# Patient Record
Sex: Female | Born: 1950 | Race: Black or African American | Hispanic: No | Marital: Single | Smoking: Never smoker
Health system: Southern US, Community
[De-identification: ages and names within clinical notes are randomized; demographics above are authoritative.]

## PROBLEM LIST (undated history)

## (undated) DIAGNOSIS — E079 Disorder of thyroid, unspecified: Secondary | ICD-10-CM

## (undated) DIAGNOSIS — I10 Essential (primary) hypertension: Secondary | ICD-10-CM

## (undated) HISTORY — DX: Disorder of thyroid, unspecified: E07.9

---

## 2015-01-28 ENCOUNTER — Other Ambulatory Visit: Payer: Self-pay | Admitting: Family Medicine

## 2015-01-28 ENCOUNTER — Ambulatory Visit
Admission: RE | Admit: 2015-01-28 | Discharge: 2015-01-28 | Disposition: A | Discharge status: Home / Self Care | Payer: Medicaid Other | Source: Ambulatory Visit | Attending: Family Medicine | Admitting: Family Medicine

## 2015-01-28 DIAGNOSIS — M06341 Rheumatoid nodule, right hand: Secondary | ICD-10-CM

## 2017-05-06 ENCOUNTER — Ambulatory Visit (HOSPITAL_COMMUNITY)
Admission: RE | Admit: 2017-05-06 | Discharge: 2017-05-06 | Disposition: A | Discharge status: Home / Self Care | Payer: Medicaid Other | Attending: Psychiatry | Admitting: Psychiatry

## 2017-05-06 DIAGNOSIS — H9190 Unspecified hearing loss, unspecified ear: Secondary | ICD-10-CM | POA: Insufficient documentation

## 2017-05-06 DIAGNOSIS — Z880 Allergy status to penicillin: Secondary | ICD-10-CM | POA: Diagnosis not present

## 2017-05-06 DIAGNOSIS — R6889 Other general symptoms and signs: Secondary | ICD-10-CM | POA: Diagnosis present

## 2017-05-06 NOTE — H&P (Signed)
Behavioral Health Medical Screening Exam  Gigi Gineggy Jimmey Ralpharker is an 66 y.o. female.  Total Time spent with patient: 20 minutes  Psychiatric Specialty Exam: Physical Exam  Constitutional: She is oriented to person, place, and time. She appears well-developed and well-nourished.  HENT:  Head: Normocephalic.  Right Ear: External ear normal.  Left Ear: External ear normal.  Neck: Normal range of motion.  Cardiovascular: Normal rate, normal heart sounds and intact distal pulses.   Respiratory: Effort normal and breath sounds normal.  GI: Soft. Bowel sounds are normal.  Musculoskeletal: Normal range of motion.  Neurological: She is alert and oriented to person, place, and time.  Skin: Skin is warm and dry.  Psychiatric: She has a normal mood and affect. Her behavior is normal. Judgment and thought content normal. Cognition and memory are normal.  Pt is deaf, uses sign language    Review of Systems  Psychiatric/Behavioral: Negative for depression, hallucinations, memory loss, substance abuse and suicidal ideas. The patient is nervous/anxious. The patient does not have insomnia.   All other systems reviewed and are negative.   Blood pressure (!) 163/98, pulse 94, temperature 98.7 F (37.1 C), temperature source Oral, resp. rate 18, SpO2 98 %.There is no height or weight on file to calculate BMI.  General Appearance: Casual and Fairly Groomed  Eye Contact:  Good  Speech:  speaks with sign language  Volume:  speaksd with sign language  Mood:  Anxious  Affect:  Congruent  Thought Process:  Coherent and Linear  Orientation:  Full (Time, Place, and Person)  Thought Content:  Logical  Suicidal Thoughts:  No  Homicidal Thoughts:  No  Memory:  Immediate;   Good Recent;   Good Remote;   Fair  Judgement:  Fair  Insight:  Fair  Psychomotor Activity:  Normal  Concentration: Concentration: Good and Attention Span: Good  Recall:  Good  Fund of Knowledge:Good  Language: Good  Akathisia:  No   Handed:  Right  AIMS (if indicated):     Assets:  Communication Skills Desire for Improvement Financial Resources/Insurance Housing Resilience Social Support Transportation  Sleep:       Musculoskeletal: Strength & Muscle Tone: within normal limits Gait & Station: normal Patient leans: N/A  Blood pressure (!) 163/98, pulse 94, temperature 98.7 F (37.1 C), temperature source Oral, resp. rate 18, SpO2 98 %.  Recommendations:  Based on my evaluation the patient does not appear to have an emergency medical condition.  Laveda AbbeLaurie Britton Adalee Kathan, NP 05/06/2017, 3:54 PM

## 2017-05-06 NOTE — BH Assessment (Signed)
Assessment Note  Angela Becker is a 66 y.o. female, [facility] her daughter, Angela Becker. Pt is deaf and an ASL interpreter was used via the virtual translater. Pt proved to be a poor historian and 3 ASL interpreters were used before it was determined that pt may have a language and/or cognitive deficit as she was barely answering the questions asked. Daughter subsequently shared that pt has been deemed by a doctor not competent enough to live alone safely. Angela Becker also shared that her mother didn't rear her as CPS was involved and declared pt as an unfit parent. Angela Becker added that pt has been living with her mother until @ a year ago when she died. Angela Becker says that pt has been living with her for @ a year. Angela Becker shares that she brought pt in to be assessed due to her changing moods, sometimes threatening. Angela Becker also reports that pt told her that she saw a monkey dancing around on her bed @ a month or 2 ago and pt "talks" to herself a lot throughout the day (ongoing for @ 6 months or more). Pt was unable to weigh in on these claims as it appeared that she just couldn't comprehend what was being said. Referrals for outpatient psychiatry given.   Diagnosis: Deferred  Past Medical History: No past medical history on file.  No past surgical history on file.  Family History: No family history on file.  Social History:  has no tobacco, alcohol, and drug history on file.  Additional Social History:  Alcohol / Drug Use Pain Medications: none noted Prescriptions: rxs for HBP & high cholesterol History of alcohol / drug use?: No history of alcohol / drug abuse  CIWA: CIWA-Ar BP: (!) 163/98 Pulse Rate: 94 COWS:    Allergies:  Allergies  Allergen Reactions  . Penicillins     Home Medications:  (Not in a hospital admission)  OB/GYN Status:  No LMP recorded.  General Assessment Data [facility] T[facility] Assessment: In system Is  this a Tele or Face-to-Face Assessment?: Face-to-Face Is this an Initial Assessment or a Re-assessment for this encounter?: Initial Assessment Marital status: Single Is patient pregnant?: No Pregnancy Status: No Living Arrangements: Children Can pt return to current living arrangement?: Yes Admission Status: Voluntary Is patient capable of signing voluntary admission?: No Referral Source: Self/Family/Friend Insurance type: Medicaid  Medical Screening Exam [facility]) Medical Exam completed: Yes  Crisis Care Plan Living Arrangements: Children Name of Psychiatrist: none Name of Therapist: none  Education Status Is patient currently in school?: No  Risk to self with the past 6 months Suicidal Ideation: No Has patient been a risk to self within the past 6 months prior to admission? : No Suicidal Intent: No Has patient had any suicidal intent within the past 6 months prior to admission? : No Is patient at risk for suicide?: No Suicidal Plan?: No Has patient had any suicidal plan within the past 6 months prior to admission? : No Access to Means: No Previous Attempts/Gestures: No Intentional Self Injurious Behavior: None Family Suicide History: Unknown Persecutory voices/beliefs?: No Depression: No Depression Symptoms: Feeling angry/irritable Substance abuse history and/or treatment for substance abuse?: No Suicide prevention information given to non-admitted patients: Not applicable  Risk to Others within the past 6 months Homicidal Ideation: No Does patient have any lifetime risk of violence toward others beyond the six months prior to admission? : No Thoughts of Harm to Others:  No Current Homicidal Intent: No Current Homicidal Plan: No Access to Homicidal Means: No History of harm to others?: No Assessment of Violence: None Noted Does patient have access to weapons?: No Criminal Charges Pending?: No Does patient have a court date: No Is patient on probation?:  No  Psychosis Hallucinations: Visual Delusions: None noted  Mental Status Report Appearance/Hygiene: Unremarkable Eye Contact: Fair Motor Activity: Unremarkable Speech: Other (Comment) (Pt is deaf.) Level of Consciousness: Alert Mood: Pleasant Affect: Appropriate to circumstance Anxiety Level: Minimal Thought Processes: Unable to Assess Judgement: Unable to Assess Orientation: Unable to assess Obsessive Compulsive Thoughts/Behaviors: Unable to Assess  Cognitive Functioning Concentration: Unable to Assess Memory: Unable to Assess IQ:  (UTA) Insight: Unable to Assess Impulse Control: Unable to Assess Sleep: Unable to Assess Vegetative Symptoms: None  ADLScreening [facility] Assessment Services) Patient's cognitive ability adequate to safely complete daily activities?: Yes Patient able to express need for assistance with ADLs?: Yes Independently performs ADLs?: Yes (appropriate for developmental age)  Prior Inpatient Therapy Prior Inpatient Therapy: No  Prior Outpatient Therapy Prior Outpatient Therapy: No Does patient have an ACCT team?: No Does patient have Intensive In-House Services?  : No Does patient have [facility] services? : No Does patient have P4CC services?: No  ADL Screening (condition at time of admission) Patient's cognitive ability adequate to safely complete daily activities?: Yes Is the patient deaf or have difficulty hearing?: Yes Does the patient have difficulty seeing, even when wearing glasses/contacts?: No Does the patient have difficulty concentrating, remembering, or making decisions?: Yes Patient able to express need for assistance with ADLs?: Yes Does the patient have difficulty dressing or bathing?: No Independently performs ADLs?: Yes (appropriate for developmental age) Does the patient have difficulty walking or climbing stairs?: No Weakness of Legs: None Weakness of Arms/Hands: None  Home Assistive Devices/Equipment Home Assistive  Devices/Equipment: None    Abuse/Neglect Assessment (Assessment to be complete while patient is alone) Self-Neglect: Yes, past (Comment) (per daughter)     [employer]chant [employer] officer (For Healthcare) Does Patient Have a Medical Advance Directive?: No Would patient like information on creating a medical advance directive?: No - Patient declined    Additional Information 1:1 In Past 12 Months?: No CIRT Risk: No Elopement Risk: No     Disposition:  Disposition Initial Assessment Completed for this Encounter: Yes (consulted with Elta Guadeloupe, NP) Disposition of Patient: Outpatient treatment Type of outpatient treatment: Adult (List of outpatient psychiatrists/therapists given. )  On Site Evaluation by:   Reviewed with Physician:    Laddie Aquas 05/06/2017 4:51 PM

## 2018-06-21 ENCOUNTER — Inpatient Hospital Stay (HOSPITAL_COMMUNITY)
Admission: EM | Admit: 2018-06-21 | Discharge: 2018-06-23 | DRG: 175 | Disposition: A | Discharge status: Home Health | Payer: Medicare Other | Attending: Internal Medicine | Admitting: Internal Medicine

## 2018-06-21 ENCOUNTER — Other Ambulatory Visit: Payer: Self-pay

## 2018-06-21 ENCOUNTER — Emergency Department (HOSPITAL_COMMUNITY): Payer: Medicare Other

## 2018-06-21 ENCOUNTER — Encounter (HOSPITAL_COMMUNITY): Payer: Self-pay | Admitting: Emergency Medicine

## 2018-06-21 DIAGNOSIS — I82412 Acute embolism and thrombosis of left femoral vein: Secondary | ICD-10-CM | POA: Diagnosis present

## 2018-06-21 DIAGNOSIS — E876 Hypokalemia: Secondary | ICD-10-CM | POA: Diagnosis present

## 2018-06-21 DIAGNOSIS — R0602 Shortness of breath: Secondary | ICD-10-CM

## 2018-06-21 DIAGNOSIS — I5033 Acute on chronic diastolic (congestive) heart failure: Secondary | ICD-10-CM | POA: Diagnosis present

## 2018-06-21 DIAGNOSIS — Z832 Family history of diseases of the blood and blood-forming organs and certain disorders involving the immune mechanism: Secondary | ICD-10-CM

## 2018-06-21 DIAGNOSIS — I2699 Other pulmonary embolism without acute cor pulmonale: Secondary | ICD-10-CM | POA: Diagnosis present

## 2018-06-21 DIAGNOSIS — I110 Hypertensive heart disease with heart failure: Secondary | ICD-10-CM | POA: Diagnosis present

## 2018-06-21 DIAGNOSIS — H9190 Unspecified hearing loss, unspecified ear: Secondary | ICD-10-CM | POA: Diagnosis present

## 2018-06-21 DIAGNOSIS — Z807 Family history of other malignant neoplasms of lymphoid, hematopoietic and related tissues: Secondary | ICD-10-CM

## 2018-06-21 DIAGNOSIS — Z8249 Family history of ischemic heart disease and other diseases of the circulatory system: Secondary | ICD-10-CM

## 2018-06-21 DIAGNOSIS — I2692 Saddle embolus of pulmonary artery without acute cor pulmonale: Secondary | ICD-10-CM | POA: Diagnosis not present

## 2018-06-21 DIAGNOSIS — H5461 Unqualified visual loss, right eye, normal vision left eye: Secondary | ICD-10-CM | POA: Diagnosis present

## 2018-06-21 DIAGNOSIS — Z833 Family history of diabetes mellitus: Secondary | ICD-10-CM | POA: Diagnosis not present

## 2018-06-21 DIAGNOSIS — F22 Delusional disorders: Secondary | ICD-10-CM | POA: Diagnosis present

## 2018-06-21 HISTORY — DX: Essential (primary) hypertension: I10

## 2018-06-21 LAB — BASIC METABOLIC PANEL
ANION GAP: 11 (ref 5–15)
BUN: 16 mg/dL (ref 8–23)
CHLORIDE: 101 mmol/L (ref 98–111)
CO2: 26 mmol/L (ref 22–32)
Calcium: 9.2 mg/dL (ref 8.9–10.3)
Creatinine, Ser: 0.87 mg/dL (ref 0.44–1.00)
GFR calc Af Amer: >60 mL/min (ref >60)
GLUCOSE: 122 mg/dL — AB (ref 70–99)
POTASSIUM: 3.2 mmol/L — AB (ref 3.5–5.1)
Sodium: 138 mmol/L (ref 135–145)

## 2018-06-21 LAB — I-STAT TROPONIN, ED: Troponin i, poc: 0.37 ng/mL (ref 0.00–0.08)

## 2018-06-21 LAB — I-STAT CG4 LACTIC ACID, ED: Lactic Acid, Venous: 1.12 mmol/L (ref 0.5–1.9)

## 2018-06-21 LAB — CBC
HCT: 39.3 % (ref 36.0–46.0)
Hemoglobin: 12.3 g/dL (ref 12.0–15.0)
MCH: 28.3 pg (ref 26.0–34.0)
MCHC: 31.3 g/dL (ref 30.0–36.0)
MCV: 90.3 fL (ref 78.0–100.0)
Platelets: 183 10*3/uL (ref 150–400)
RBC: 4.35 MIL/uL (ref 3.87–5.11)
RDW: 13.2 % (ref 11.5–15.5)
WBC: 10.5 10*3/uL (ref 4.0–10.5)

## 2018-06-21 MED ORDER — HEPARIN BOLUS VIA INFUSION
4300.0000 [IU] | Freq: Once | INTRAVENOUS | Status: DC
  Filled 2018-06-21: qty 4300

## 2018-06-21 MED ORDER — LEVOFLOXACIN IN D5W 750 MG/150ML IV SOLN
750.0000 mg | Freq: Once | INTRAVENOUS | Status: DC
  Filled 2018-06-21: qty 150

## 2018-06-21 MED ORDER — HEPARIN BOLUS VIA INFUSION
3700.0000 [IU] | Freq: Once | INTRAVENOUS | Status: AC
  Administered 2018-06-21: 3700 [IU] via INTRAVENOUS
  Filled 2018-06-21: qty 3700

## 2018-06-21 MED ORDER — LEVOFLOXACIN IN D5W 750 MG/150ML IV SOLN: 750.0000 mg | INTRAVENOUS | Status: DC

## 2018-06-21 MED ORDER — IOPAMIDOL (ISOVUE-370) INJECTION 76%
100.0000 mL | Freq: Once | INTRAVENOUS | Status: AC | PRN
  Administered 2018-06-21: 100 mL via INTRAVENOUS

## 2018-06-21 MED ORDER — HEPARIN (PORCINE) IN NACL 100-0.45 UNIT/ML-% IJ SOLN
1250.0000 [IU]/h | INTRAMUSCULAR | Status: DC
  Administered 2018-06-21: 1050 [IU]/h via INTRAVENOUS
  Administered 2018-06-22: 1150 [IU]/h via INTRAVENOUS
  Filled 2018-06-21 (×2): qty 250

## 2018-06-21 MED ORDER — IOPAMIDOL (ISOVUE-370) INJECTION 76%
INTRAVENOUS | Status: AC
  Filled 2018-06-21: qty 100

## 2018-06-21 NOTE — ED Notes (Signed)
Hold IV levaquin per C. Lawyer, PA

## 2018-06-21 NOTE — Progress Notes (Signed)
ANTICOAGULATION CONSULT NOTE - Initial Consult  Pharmacy Consult for heparin Indication: pulmonary embolus  Allergies  Allergen Reactions  . Penicillins     Patient Measurements: Weight: 160 lb (72.6 kg) Heparin Dosing Weight: 60.8 kg  Vital Signs: Temp: 98.7 F (37.1 C) (09/04 1900) Temp Source: Oral (09/04 1900) BP: 149/100 (09/04 2230) Pulse Rate: 117 (09/04 2230)  Labs: Recent Labs    06/21/18 1920  HGB 12.3  HCT 39.3  PLT 183  CREATININE 0.87    CrCl cannot be calculated (Unknown ideal weight.).   Medical History: Past Medical History:  Diagnosis Date  . Hypertension     Medications:    Assessment: 67 yo deaf female admitted for swollen legs. Starting heparin for PE. Not on anticoagulants PTA per patient's daughter. CBC WNL  Goal of Therapy:  Heparin level 0.3-0.7 units/ml Monitor platelets by anticoagulation protocol: Yes   Plan:  Heparin 3700 units IV x1 Heparin 1050 units/hr IV infusion  6hr heparin level  Daily CBC and heparin level   Ewing Schlein, PharmD PGY1 Pharmacy Resident 06/21/2018    11:17 PM

## 2018-06-21 NOTE — Progress Notes (Signed)
Pharmacy Antibiotic Note  Maryssa Sollazzo is a 67 y.o. female admitted on 06/21/2018 with pneumonia.  Pharmacy has been consulted for levofloxacin dosing. WBC 10.5, afebrile, sCr 0.87.  Plan: Levofloxacin 750mg  IV Q24H F/u LOT/de-escalation, cultures, renal function     Temp (24hrs), Avg:98.7 F (37.1 C), Min:98.7 F (37.1 C), Max:98.7 F (37.1 C)  Recent Labs  Lab 06/21/18 1920  WBC 10.5  CREATININE 0.87    CrCl cannot be calculated (Unknown ideal weight.).    Allergies  Allergen Reactions  . Penicillins     Antimicrobials this admission: Levofloxacin  9/4 >>    Dose adjustments this admission:   Microbiology results:  BCx:  UCx:    Sputum:    MRSA PCR:   Thank you for allowing pharmacy to be a part of this patient's care.  Ewing Schlein, PharmD PGY1 Pharmacy Resident 06/21/2018    10:10 PM

## 2018-06-21 NOTE — ED Notes (Signed)
Troponin results given to Dr. Pfeiffer 

## 2018-06-21 NOTE — ED Notes (Signed)
Bethanie Dicker (daughter)-call for updates  [phone]

## 2018-06-21 NOTE — ED Provider Notes (Signed)
Medical screening examination/treatment/procedure(s) were conducted as a shared visit with non-physician practitioner(s) and myself.  I personally evaluated the patient during the encounter.  EKG Interpretation  Date/Time:  Wednesday June 21 2018 22:49:57 EDT Ventricular Rate:  108 PR Interval:  128 QRS Duration: 80 QT Interval:  317 QTC Calculation: 425 R Axis:   101 Text Interpretation:  Sinus tachycardia Right axis deviation Nonspecific T abnormalities, lateral leads No significant change since last tracing earlier in the day  Confirmed by Richardean Canal [phone]) on 06/22/2018 7:32:59 PM  Patient has been seen with her family members present.  Patient is deaf.  Her daughter gives history of patient appearing short of breath over about the past 2 days.  Patient's daughter reports that she almost never complains about anything.  They have mostly had to observe the symptoms.  When directly asked as I am in the room, patient does indicate some chest symptoms by putting her hand to her chest centrally.  Patient is alert and appropriate.  She does not have respiratory distress at rest.  Heart borderline tachycardia.  No rub murmur gallop.  Lungs grossly clear with some diminished airflow to the bases.  Abdomen soft without guarding.  Lower extremities nontender but appears to have some subtle asymmetric swelling to the left.  Patient had borderline elevated troponin without STEMI changes on EKG. With tachycardia and observed shortness of breath, there was concern for pulmonary embolus.  This was confirmed by CTA of the chest.  Patient started on heparin and admitted.      Arby Barrette, MD 07/03/18 1351

## 2018-06-21 NOTE — ED Triage Notes (Signed)
Pt to ER for evaluation brought by daughter - patient is deaf. Able to tell me both legs are swollen. HR noted to be 119, patient states right leg hurts worse than the other.

## 2018-06-22 ENCOUNTER — Inpatient Hospital Stay (HOSPITAL_COMMUNITY): Payer: Medicare Other

## 2018-06-22 ENCOUNTER — Encounter (HOSPITAL_COMMUNITY): Payer: Self-pay | Admitting: Internal Medicine

## 2018-06-22 ENCOUNTER — Other Ambulatory Visit: Payer: Self-pay

## 2018-06-22 DIAGNOSIS — H9190 Unspecified hearing loss, unspecified ear: Secondary | ICD-10-CM | POA: Diagnosis not present

## 2018-06-22 DIAGNOSIS — Z832 Family history of diseases of the blood and blood-forming organs and certain disorders involving the immune mechanism: Secondary | ICD-10-CM | POA: Diagnosis not present

## 2018-06-22 DIAGNOSIS — I2692 Saddle embolus of pulmonary artery without acute cor pulmonale: Principal | ICD-10-CM

## 2018-06-22 DIAGNOSIS — E876 Hypokalemia: Secondary | ICD-10-CM | POA: Diagnosis not present

## 2018-06-22 DIAGNOSIS — F22 Delusional disorders: Secondary | ICD-10-CM | POA: Diagnosis not present

## 2018-06-22 DIAGNOSIS — R0602 Shortness of breath: Secondary | ICD-10-CM | POA: Diagnosis not present

## 2018-06-22 DIAGNOSIS — Z807 Family history of other malignant neoplasms of lymphoid, hematopoietic and related tissues: Secondary | ICD-10-CM | POA: Diagnosis not present

## 2018-06-22 DIAGNOSIS — I110 Hypertensive heart disease with heart failure: Secondary | ICD-10-CM | POA: Diagnosis not present

## 2018-06-22 DIAGNOSIS — I82412 Acute embolism and thrombosis of left femoral vein: Secondary | ICD-10-CM | POA: Diagnosis not present

## 2018-06-22 DIAGNOSIS — Z8249 Family history of ischemic heart disease and other diseases of the circulatory system: Secondary | ICD-10-CM | POA: Diagnosis not present

## 2018-06-22 DIAGNOSIS — R609 Edema, unspecified: Secondary | ICD-10-CM | POA: Diagnosis not present

## 2018-06-22 DIAGNOSIS — I2602 Saddle embolus of pulmonary artery with acute cor pulmonale: Secondary | ICD-10-CM | POA: Diagnosis not present

## 2018-06-22 DIAGNOSIS — I5033 Acute on chronic diastolic (congestive) heart failure: Secondary | ICD-10-CM | POA: Diagnosis not present

## 2018-06-22 DIAGNOSIS — I2699 Other pulmonary embolism without acute cor pulmonale: Secondary | ICD-10-CM

## 2018-06-22 DIAGNOSIS — Z833 Family history of diabetes mellitus: Secondary | ICD-10-CM | POA: Diagnosis not present

## 2018-06-22 DIAGNOSIS — H5461 Unqualified visual loss, right eye, normal vision left eye: Secondary | ICD-10-CM | POA: Diagnosis not present

## 2018-06-22 HISTORY — DX: Other pulmonary embolism without acute cor pulmonale: I26.99

## 2018-06-22 LAB — TROPONIN I
TROPONIN I: 0.14 ng/mL — AB (ref <0.03)
Troponin I: 0.18 ng/mL (ref <0.03)

## 2018-06-22 LAB — I-STAT CG4 LACTIC ACID, ED: Lactic Acid, Venous: 0.78 mmol/L (ref 0.5–1.9)

## 2018-06-22 LAB — COMPREHENSIVE METABOLIC PANEL
ALBUMIN: 3.1 g/dL — AB (ref 3.5–5.0)
ALT: 20 U/L (ref 0–44)
ANION GAP: 9 (ref 5–15)
AST: 33 U/L (ref 15–41)
Alkaline Phosphatase: 99 U/L (ref 38–126)
BUN: 13 mg/dL (ref 8–23)
CHLORIDE: 104 mmol/L (ref 98–111)
CO2: 26 mmol/L (ref 22–32)
Calcium: 8.8 mg/dL — ABNORMAL LOW (ref 8.9–10.3)
Creatinine, Ser: 0.75 mg/dL (ref 0.44–1.00)
GFR calc non Af Amer: >60 mL/min (ref >60)
GLUCOSE: 112 mg/dL — AB (ref 70–99)
Potassium: 3.4 mmol/L — ABNORMAL LOW (ref 3.5–5.1)
SODIUM: 139 mmol/L (ref 135–145)
Total Bilirubin: 0.8 mg/dL (ref 0.3–1.2)
Total Protein: 7.2 g/dL (ref 6.5–8.1)

## 2018-06-22 LAB — MRSA PCR SCREENING: MRSA BY PCR: NEGATIVE

## 2018-06-22 LAB — CBC
HCT: 35.8 % — ABNORMAL LOW (ref 36.0–46.0)
Hemoglobin: 11.3 g/dL — ABNORMAL LOW (ref 12.0–15.0)
MCH: 27.8 pg (ref 26.0–34.0)
MCHC: 31.6 g/dL (ref 30.0–36.0)
MCV: 88 fL (ref 78.0–100.0)
PLATELETS: 186 10*3/uL (ref 150–400)
RBC: 4.07 MIL/uL (ref 3.87–5.11)
RDW: 13.2 % (ref 11.5–15.5)
WBC: 10 10*3/uL (ref 4.0–10.5)

## 2018-06-22 LAB — BRAIN NATRIURETIC PEPTIDE: B Natriuretic Peptide: 178.2 pg/mL — ABNORMAL HIGH (ref 0.0–100.0)

## 2018-06-22 LAB — ECHOCARDIOGRAM COMPLETE
HEIGHTINCHES: 66 in
Weight: 2148.16 oz

## 2018-06-22 LAB — MAGNESIUM: MAGNESIUM: 1.6 mg/dL — AB (ref 1.7–2.4)

## 2018-06-22 LAB — HEPARIN LEVEL (UNFRACTIONATED)
Heparin Unfractionated: 0.53 IU/mL (ref 0.30–0.70)
Heparin Unfractionated: 0.43 IU/mL (ref 0.30–0.70)

## 2018-06-22 MED ORDER — LEVOFLOXACIN IN D5W 750 MG/150ML IV SOLN: 750.0000 mg | INTRAVENOUS | Status: DC

## 2018-06-22 MED ORDER — MAGNESIUM SULFATE 2 GM/50ML IV SOLN
2.0000 g | Freq: Once | INTRAVENOUS | Status: AC
  Administered 2018-06-22: 2 g via INTRAVENOUS
  Filled 2018-06-22: qty 50

## 2018-06-22 MED ORDER — ACETAMINOPHEN 325 MG PO TABS
650.0000 mg | ORAL_TABLET | Freq: Four times a day (QID) | ORAL | Status: DC | PRN
  Administered 2018-06-22 – 2018-06-23 (×2): 650 mg via ORAL
  Filled 2018-06-22 (×2): qty 2

## 2018-06-22 MED ORDER — POTASSIUM CHLORIDE CRYS ER 20 MEQ PO TBCR: 40.0000 meq | EXTENDED_RELEASE_TABLET | Freq: Once | ORAL | Status: DC

## 2018-06-22 MED ORDER — POTASSIUM CHLORIDE CRYS ER 20 MEQ PO TBCR
40.0000 meq | EXTENDED_RELEASE_TABLET | Freq: Once | ORAL | Status: AC
  Administered 2018-06-22: 40 meq via ORAL
  Filled 2018-06-22: qty 2

## 2018-06-22 NOTE — Progress Notes (Signed)
ANTICOAGULATION CONSULT NOTE - Follow Up Consult  Pharmacy Consult for Heparin Indication: pulmonary embolus  Allergies  Allergen Reactions  . Penicillins     Patient Measurements: Height: 5\' 6"  (167.6 cm) Weight: 134 lb 4.2 oz (60.9 kg) IBW/kg (Calculated) : 59.3 Heparin Dosing Weight: 60.9 kg  Vital Signs: Temp: 99.7 F (37.6 C) (09/05 0821) Temp Source: Oral (09/05 0821) BP: 119/84 (09/05 0821) Pulse Rate: 104 (09/05 0821)  Labs: Recent Labs    06/21/18 1920 06/22/18 0229 06/22/18 0820  HGB 12.3  --  11.3*  HCT 39.3  --  35.8*  PLT 183  --  186  HEPARINUNFRC  --  0.53 0.43  CREATININE 0.87  --  0.75  TROPONINI  --  0.18* 0.14*    Estimated Creatinine Clearance: 65.6 mL/min (by C-G formula based on SCr of 0.75 mg/dL).   Medications:  Scheduled:  . iopamidol      . potassium chloride  40 mEq Oral Once   Infusions:  . heparin 1,050 Units/hr (06/21/18 2316)  . magnesium sulfate 1 - 4 g bolus IVPB      Assessment: 67 yo F presented to ED with LE swelling and SOB.  CT showed saddle PE and patient was initiated on heparin.  Heparin level is at lower end of therapeutic goal on 1050 units/hr.  Will increase infusion rate to maintain therapeutic rate given bilateral PE.  Goal of Therapy:  Heparin level 0.3-0.7 units/ml Monitor platelets by anticoagulation protocol: Yes   Plan:  Increase heparin to 1150 units/hr Heparin level and CBC daily while on heparin Follow-up plans for oral anticoagulation  Lailany Enoch, Pharm.D., BCPS Clinical Pharmacist Pager: [phone] Clinical phone for 06/22/2018 from 8:30-4:00 is [phone].  **Pharmacist phone directory can now be found on [email or web address] ([email or web address]).  Listed under [facility].  06/22/2018 9:45 AM

## 2018-06-22 NOTE — Progress Notes (Signed)
ANTICOAGULATION CONSULT NOTE  Pharmacy Consult for heparin Indication: pulmonary embolus  Allergies  Allergen Reactions  . Penicillins     Patient Measurements: Height: 5\' 6"  (167.6 cm) Weight: 134 lb 4.2 oz (60.9 kg) IBW/kg (Calculated) : 59.3 Heparin Dosing Weight: 60.8 kg  Vital Signs: Temp: 99.9 F (37.7 C) (09/05 0132) Temp Source: Oral (09/05 0132) BP: 122/87 (09/05 0101) Pulse Rate: 111 (09/05 0132)  Labs: Recent Labs    06/21/18 1920 06/22/18 0229  HGB 12.3  --   HCT 39.3  --   PLT 183  --   HEPARINUNFRC  --  0.53  CREATININE 0.87  --     Estimated Creatinine Clearance: 60.4 mL/min (by C-G formula based on SCr of 0.87 mg/dL).  Assessment: 67 y.o. female with PE for heparin  Goal of Therapy:  Heparin level 0.3-0.7 units/ml Monitor platelets by anticoagulation protocol: Yes   Plan:  Continue Heparin at current rate Check heparin level in 6 hours to confirm  Geannie Risen, PharmD, BCPS  06/22/2018    3:14 AM

## 2018-06-22 NOTE — H&P (Signed)
Angela Becker TWS:[id number] DOB: 02-03-1951 DOA: 06/21/2018     PCP: Patient, No Pcp Per   Outpatient Specialists: Psychiatry    Patient arrived to ER on 06/21/18 at [facility]  Patient coming from: home Lives  With family   Chief Complaint:  Chief Complaint  Patient presents with  . Shortness of Breath    HPI: Angela Becker is a 67 y.o. female with medical history significant of deafness, HTN delusional disorder    Presented with   Shortness of breath , leg edema left>Right Shortness of breath but no fever or chills  hx of travel 1 month ago patient went to [place]. Family feels that for the past few weeks she has been moving somewhat slower they were attributing this to psychiatric medications that she was started for delusional disorder.  Family is unsure what the dosages of what medications but think is probably Haldol.  She also has been on blood pressure medications clonidine in the past but it has been changed family at this point unsure what the new medication was.  She has a sister with multiple myeloma who had blood clots but otherwise no unexplained history of blood clots in the family or patient herself.  Patient has been gaining weight.  No hormonal medications.  She has not been compliant with colonoscopy or mammogram secondary to psychiatric disorder.  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 DG Chest 2 View  . CT Angio Chest PE W/Cm &/Or Wo Cm  . Basic metabolic panel  . CBC  . Urinalysis, Routine w reflex microscopic  . Heparin level  (unfractionated)  . CBC  . Diet NPO time specified  . Cardiac monitoring  . Saline Lock IV, Maintain IV access  . Refer to Sidebar Report for: Sepsis Bundle ED/IP  . Document vital signs within 1-hour of fluid bolus completion and notify provider of bolus completion  . Document Actual / Estimated Weight  . Insert peripheral IV x 2  . Initiate Carrier Fluid Protocol  . Call Code Sepsis ([facility] [phone]) Reason for Consult? tracking  . pharmacy consult  . levofloxacin [facility]) per pharmacy consult  . heparin per pharmacy consult  . Consult to hospitalist  . Pulse oximetry, continuous  . I-stat troponin, ED  . I-Stat CG4 Lactic Acid, ED  (not at  [facility])  . ED EKG within 10 minutes  . Repeat EKG  . EKG 12-Lead    Following Medications were ordered in ER: Medications  levofloxacin (LEVAQUIN) IVPB 750 mg (0 mg Intravenous Hold 06/21/18 2240)  iopamidol (ISOVUE-370) 76 % injection (has no administration in time range)  heparin ADULT infusion 100 units/mL (25000 units/240m sodium chloride 0.45%) (1,050 Units/hr Intravenous New Bag/Given 06/21/18 2316)  levofloxacin (LEVAQUIN) IVPB 750 mg (has no administration in time range)  iopamidol (ISOVUE-370) 76 % injection 100 mL (100 mLs Intravenous Contrast Given 06/21/18 2216)  heparin bolus via infusion 3,700 Units (3,700 Units Intravenous Bolus from Bag 06/21/18 2316)    Significant initial  Findings: Abnormal Labs Reviewed  BASIC METABOLIC PANEL -  Abnormal; Notable for the following components:      Result Value   Potassium 3.2 (*)    Glucose, Bld 122 (*)    All other components within normal limits  I-STAT TROPONIN, ED - Abnormal; Notable for the following components:   Troponin i, poc 0.37 (*)    All other components within normal limits     Na 138 K 3.2  Cr    Stable,  Lab Results  Component Value Date   CREATININE 0.87 06/21/2018      WBC  10.5  HG/HCT  stable,    Component Value Date/Time   HGB 12.3 06/21/2018  1920   HCT 39.3 06/21/2018 1920       Troponin (Point of Care Test) Recent Labs    06/21/18 1943  TROPIPOC 0.37*     BNP (last 3 results) No results for input(s): BNP in the last 8760 hours.  ProBNP (last 3 results) No results for input(s): PROBNP in the last 8760 hours.  Lactic Acid, Venous    Component Value Date/Time   LATICACIDVEN 1.12 06/21/2018 2221      UA   ordered     CXR - Infiltrates bilateral  CTA chest  - Saddle PE  ECG:  Personally reviewed by me showing: HR : 108 Rhythm:  Sinus tachycardia  Ischemic    no evidence of ischemic changes QTC 425     ED Triage Vitals  Enc Vitals Group     BP 06/21/18 1900 127/87     Pulse Rate 06/21/18 1900 (!) 119     Resp 06/21/18 1900 18     Temp 06/21/18 1900 98.7 F (37.1 C)     Temp Source 06/21/18 1900 Oral     SpO2 06/21/18 1900 97 %     Weight 06/21/18 2249 160 lb (72.6 kg)     Height --      Head Circumference --      Peak Flow --      Pain Score 06/21/18 2055 0     Pain Loc --      Pain Edu? --      Excl. in [place]? --   TMAX(24)@       Latest  Blood pressure 109/83, pulse (!) 101, temperature 98.7 F (37.1 C), temperature source Oral, resp. rate 18, weight 60.8 kg, SpO2 99 %.      Hospitalist was called for admission for  Large Saddle PE   Review of Systems:    Pertinent positives include: shortness of breath at rest.  dyspnea on exertion,  Bilateral lower extremity swelling   Constitutional:  No weight loss, night sweats, Fevers, chills, fatigue, weight loss  HEENT:  No headaches, Difficulty swallowing,Tooth/dental problems,Sore throat,  No sneezing, itching, ear ache, nasal congestion, post nasal drip,  Cardio-vascular:  No chest pain, Orthopnea, PND, anasarca, dizziness, palpitations.noGI:  No heartburn, indigestion, abdominal pain, nausea, vomiting, diarrhea, change in bowel habits, loss of appetite, melena, blood in stool, hematemesis Resp:  no  No excess mucus, no productive  cough, No non-productive cough, No coughing up of blood.No change in color of mucus.No wheezing. Skin:  no rash or lesions. No jaundice GU:  no dysuria, change in color of urine, no urgency or frequency. No straining to urinate.  No flank pain.  Musculoskeletal:  No joint pain or no joint swelling. No decreased range of motion. No back pain.  Psych:  No change in mood or affect. No depression or anxiety. No memory loss.  Neuro: no localizing neurological complaints, no tingling, no weakness, no double vision, no gait abnormality, no slurred speech, no confusion  All systems reviewed and apart from Angela Becker all are negative  Past Medical History:   Past Medical History:  Diagnosis Date  . Hypertension       History reviewed. No pertinent surgical history.  Social History:  Ambulatory   independently       reports that she has never smoked. She has never used smokeless tobacco. Her alcohol and drug histories are not on file.     Family History:   Family History  Problem Relation Age of Onset  . CAD Mother   . Hypertension Mother   . Clotting disorder Sister   . Multiple myeloma Sister   . Diabetes Sister   . CAD Sister   . Hypertension Sister   . Diabetes Other     Allergies: Allergies  Allergen Reactions  . Penicillins      Prior to Admission medications   Not on File   Physical Exam: Blood pressure 109/83, pulse (!) 101, temperature 98.7 F (37.1 C), temperature source Oral, resp. rate 18, weight 60.8 kg, SpO2 99 %. 1. General:  in No Acute distress  Chronically ill -appearing 2. Psychological: Alert and  Oriented 3. Head/ENT:    Dry Mucous Membranes                          Head Non traumatic, neck supple                            Poor Dentition 4. SKIN:   decreased Skin turgor,  Skin clean Dry and intact no rash 5. Heart: Regular rate and rhythm no  Murmur, no Rub or gallop 6. Lungs:  no wheezes or crackles   7. Abdomen: Soft,  non-tender, Non  distended      bowel sounds present 8. Lower extremities: no clubbing, cyanosis, or 1+ edema left more than right 9. Neurologically Grossly intact, moving all 4 extremities equally   10. MSK: Normal range of motion   LABS:     Recent Labs  Lab 06/21/18 1920  WBC 10.5  HGB 12.3  HCT 39.3  MCV 90.3  PLT 599   Basic Metabolic Panel: Recent Labs  Lab 06/21/18 1920  NA 138  K 3.2*  CL 101  CO2 26  GLUCOSE 122*  BUN 16  CREATININE 0.87  CALCIUM 9.2      No results for input(s): AST, ALT, ALKPHOS, BILITOT, PROT, ALBUMIN in the last 168 hours. No results for input(s): LIPASE, AMYLASE in the last 168 hours. No results for input(s): AMMONIA in the last 168 hours.    HbA1C: No results for input(s): HGBA1C in the last 72 hours. CBG: No results for input(s): GLUCAP in the last 168 hours.    Urine analysis: No results found for: COLORURINE, APPEARANCEUR, LABSPEC, PHURINE, GLUCOSEU, HGBUR, BILIRUBINUR, KETONESUR, PROTEINUR, UROBILINOGEN, NITRITE, LEUKOCYTESUR     Cultures: No results found for: SDES, SPECREQUEST, CULT, REPTSTATUS   Radiological Exams on Admission: Dg Chest 2 View  Result Date: 06/21/2018 CLINICAL DATA:  Short of breath EXAM: CHEST - 2 VIEW COMPARISON:  None. FINDINGS: Bibasilar airspace disease. No pleural effusion. Heart size upper normal. No pneumothorax. IMPRESSION: Bibasilar airspace disease suspicious for pneumonia. Electronically Signed   By: Donavan Foil M.D.   On: 06/21/2018 20:00   Ct Angio Chest  Pe W/cm &/or Wo Cm  Result Date: 06/21/2018 CLINICAL DATA:  Chest pain with bilateral lower extremity swelling and tachycardia. EXAM: CT ANGIOGRAPHY CHEST WITH CONTRAST TECHNIQUE: Multidetector CT imaging of the chest was performed using the standard protocol during bolus administration of intravenous contrast. Multiplanar CT image reconstructions and MIPs were obtained to evaluate the vascular anatomy. CONTRAST:  139m ISOVUE-370 IOPAMIDOL (ISOVUE-370)  INJECTION 76% COMPARISON:  None. FINDINGS: Cardiovascular: --Pulmonary arteries: Contrast injection is sufficient to demonstrate satisfactory opacification of the pulmonary arteries to the segmental level.There is technically a saddle embolus with a thin embolic strain and crossing the pulmonary arterial bifurcation. The bulk of the embolic burden is within the proximal right and left pulmonary arteries, extending into all lobar branches on the right and into the left lower lobar branches. There is relative sparing of the left upper lobe and lingula. There is borderline right heart strain with an RV/LV ratio of approximately 1. The main pulmonary artery is within normal limits for size. --Aorta: Limited opacification of the aorta due to bolus timing optimization for the pulmonary arteries. Conventional 3 vessel aortic branching pattern. The aortic course and caliber are normal. There is no aortic atherosclerosis. --Heart: Normal size. No pericardial effusion. Mediastinum/Nodes: No mediastinal, hilar or axillary lymphadenopathy. The visualized thyroid and thoracic esophageal course are unremarkable. Lungs/Pleura: Multifocal subsegmental atelectasis. No consolidation or pulmonary infarct. No pleural effusion. Upper Abdomen: Contrast bolus timing is not optimized for evaluation of the abdominal organs. Within this limitation, the visualized organs of the upper abdomen are normal. Musculoskeletal: No chest wall abnormality. No acute or significant osseous findings. Review of the MIP images confirms the above findings. IMPRESSION: 1. Saddle pulmonary embolus with bulk of the embolic burden in the proximal right and left pulmonary arteries with borderline CT evidence of right heart strain (RV/LV Ratio = 1.0) suggestive of submassive (intermediate risk) PE. The presence of right heart strain has been associated with an increased risk of morbidity and mortality. Please activate Code PE by paging [phone] 2. No pleural  effusion, pulmonary infarct or consolidation. Critical Value/emergent results were called by telephone at the time of interpretation on 06/21/2018 at 10:36 pm [facility] who verbally acknowledged these results. Electronically Signed   By: KUlyses JarredM.D.   On: 06/21/2018 22:41    Chart has been reviewed    Assessment/Plan   67y.o. female with medical history significant of deafness, HTN delusional disorderAdmitted for large saddle PE  Present on Admission: . Pulmonary embolus (HCC) - Admit to   stepdown Initiate heparin drip  Would likely benefit from case manager consult for long term anticoagulation Hold home blood pressure medications avoid hypotension Cycle cardiac enzymes, BNP  Order echogram STAT IN AM will need to notify pulmonology if there is  evidence of significant strain  lower extremity Dopplers  Most likely risk factors for hypercoagulable state being  recent travel  sedentary lifestyle    Would benefit from hypercoagulable workup as an outpatient   [facility]   [facility] and   per PCCM patient not a candidate for tPA will obtain BNP and call back for results. Obtain echogram to be done first thing in a.m. further management pending work-up  . Hypokalemia replace and follow check magnesium level   Delusional disorder.  Once family able to bring home medications to confirm the pharmacy it would be able to restart Other plan as per orders.  DVT prophylaxis:  heparin    Code Status:  FULL CODE  as per patient   I had personally discussed CODE STATUS with patient and family     Family Communication:   Family    at  Bedside  plan of care was discussed with   Son, Son in [place], [place], Daughter,    Disposition Plan:      To home once workup is complete and patient is stable                                       Consults called: Pulmonology  Admission status:   inpatient     Expect 2 midnight stay secondary to severity of patient's current  illness including hemodynamic instability despite optimal treatment (tachycardia  Hypoxia)  I expect  patient to be hospitalized for 2 midnights requiring inpatient medical care. Patient is at high risk for adverse outcome (such as loss of life or disability) if not treated. Indication for inpatient stay as follows:  Hemodynamic instability despite maximal medical therapy,    Need for IV anticoagulation and monitoring in intensive setting such as stepdown    Level of care  SDU       Kentrel Clevenger 06/22/2018, 1:15 AM    [facility]  Pager [phone]   after 2 AM please page floor coverage PA If 7AM-7PM, please contact the day team taking care of the patient  [email or web address]  Password [email or web address]

## 2018-06-22 NOTE — Care Management (Signed)
06-22-18 BENEFITS  CHECK :  #    1. PRIMARY INS: [employer]         2. SECONDARY INS: MEDICAID [place] ACCESS EFF-DATE : 06-18-2018 CO-PAY- $3.80 FOR EACH RX

## 2018-06-22 NOTE — Progress Notes (Signed)
  Echocardiogram 2D Echocardiogram has been performed.  Leta Jungling M 06/22/2018, 8:29 AM

## 2018-06-22 NOTE — Progress Notes (Signed)
Bilateral lower extremity venous duplex has been completed. There is evidence of acute deep vein thrombosis involving the femoral, popliteal, and intramuscular gastrocnemius veins of the left lower extremity. Negative for DVT on the right. Results were given to the patient's nurse, [facility].  06/22/18 12:48 PM Olen Cordial RVT

## 2018-06-22 NOTE — Progress Notes (Signed)
@IPLOG @        PROGRESS NOTE                                                                                                                                                                                                             Patient Demographics:    Angela Becker, is a 67 y.o. female, DOB - 11-09-1950, ZOX:[id number]  Admit date - 06/21/2018   Admitting Physician Therisa Doyne, MD  Outpatient Primary MD for the patient is Patient, No Pcp Per  LOS - 0  Chief Complaint  Patient presents with  . Shortness of Breath       Brief Narrative  67 y.o. AA female with medical history significant of deafness, HTN,  delusional disorder who has recent history of travel to [place] 1 month ago came to the hospital with chief complaints of shortness of breath, she was diagnosed with large saddle PE and admitted for further treatment.   Subjective:    Angela Becker today has, No headache, No chest pain, No abdominal pain - No Nausea, No new weakness tingling or numbness, No Cough - SOB. Answered with not of the head.  She is deaf at baseline.   Assessment  & Plan :     1.  Shortness of breath caused by large saddle PE.  Positive troponin but no significant heart strain on the echocardiogram, will continue heparin drip for a total of at least 24 hours, continue supportive care, she is hemodynamically stable except for mild tachycardia, monitor on telemetry bed and heparin drip.  2.  Mild acute on chronic diastolic CHF.  EF 65%.  Does have 1+ edema in both lower extremities.  Start diuresis once #1 has stabilized and blood pressures are fine, TED stockings for now.  3.  Hypokalemia.  Replaced orally.  4. HX of delusions.  Outpatient psych follow-up.   Family Communication  :  None present  Code Status :  Full  Disposition Plan  :  Stepdown  Consults  :    Procedures  :  CTA -  1. Saddle pulmonary embolus with bulk of the embolic burden in the proximal right and left pulmonary  arteries with borderline CT evidence of right heart strain (RV/LV Ratio = 1.0) suggestive of submassive (intermediate risk) PE. The presence of right heart strain has been associated with an increased risk of morbidity and mortality. Please activate Code PE by paging [phone]. 2. No pleural effusion, pulmonary infarct or consolidation.    TTE   Left ventricle: The cavity  size was normal. There was mild focal basal hypertrophy of the septum. Systolic function was normal. The estimated ejection fraction was in the range of 60% to 65%. Wall motion was normal; there were no regional wall motion abnormalities. Doppler parameters are consistent with abnormal left ventricular relaxation (grade 1 diastolic dysfunction). - Aortic valve: There was no regurgitation. - Mitral valve: There was no regurgitation. - Right ventricle: The cavity size was mildly dilated. Wall thickness was normal. Systolic function was normal. - Right atrium: The atrium was normal in size. - Pulmonary arteries: Systolic pressure was at the upper limits of normal. PA peak pressure: 31 mm Hg (S). - Inferior vena cava: The vessel was dilated. The respirophasic diameter changes were blunted (< 50%), consistent with elevated central venous pressure. - Pericardium, extracardiac: There was no pericardial effusion.  Impressions:   No signs of significant RV strain, RVSP at the upper normal level.  DVT Prophylaxis  :    Heparin gtt  Lab Results  Component Value Date   PLT 186 06/22/2018    Diet :  Diet Order            DIET SOFT Room service appropriate? Yes; Fluid consistency: Thin  Diet effective now               Inpatient Medications Scheduled Meds: . potassium chloride  40 mEq Oral Once   Continuous Infusions: . heparin 1,150 Units/hr (06/22/18 0957)   PRN Meds:.acetaminophen  Antibiotics  :   Anti-infectives (From admission, onward)   Start     Dose/Rate Route Frequency Ordered Stop   06/22/18 2200   levofloxacin (LEVAQUIN) IVPB 750 mg  Status:  Discontinued     750 mg 100 mL/hr over 90 Minutes Intravenous Every 24 hours 06/21/18 2327 06/22/18 0128   06/22/18 0130  levofloxacin (LEVAQUIN) IVPB 750 mg  Status:  Discontinued     750 mg 100 mL/hr over 90 Minutes Intravenous Every 24 hours 06/22/18 0128 06/22/18 0344   06/21/18 2200  levofloxacin (LEVAQUIN) IVPB 750 mg  Status:  Discontinued     750 mg 100 mL/hr over 90 Minutes Intravenous  Once 06/21/18 2152 06/22/18 0128          Objective:   Vitals:   06/22/18 0101 06/22/18 0132 06/22/18 0300 06/22/18 0821  BP: 122/87 126/82 110/82 119/84  Pulse: (!) 115 (!) 111 100 (!) 104  Resp: (!) 21 20 18  (!) 22  Temp:  99.9 F (37.7 C) 98.4 F (36.9 C) 99.7 F (37.6 C)  TempSrc:  Oral Axillary Oral  SpO2: 97% 97% 100% 97%  Weight:  60.9 kg    Height:  5\' 6"  (1.676 m)      Wt Readings from Last 3 Encounters:  06/22/18 60.9 kg     Intake/Output Summary (Last 24 hours) at 06/22/2018 1130 Last data filed at 06/22/2018 0700 Gross per 24 hour  Intake 63 ml  Output -  Net 63 ml     Physical Exam  Awake Alert, Oriented X 3, No new F.N deficits,   [place].AT,PERRAL Supple Neck,No JVD, No cervical lymphadenopathy appriciated.  Symmetrical Chest wall movement, Good air movement bilaterally, CTAB RRR,No Gallops,Rubs or new Murmurs, No Parasternal Heave +ve B.Sounds, Abd Soft, No tenderness, No organomegaly appriciated, No rebound - guarding or rigidity. No Cyanosis, Clubbing , 1+edema, No new Rash or bruise       Data Review:    CBC Recent Labs  Lab 06/21/18 1920 06/22/18 0820  WBC 10.5 10.0  HGB 12.3 11.3*  HCT 39.3 35.8*  PLT 183 186  MCV 90.3 88.0  MCH 28.3 27.8  MCHC 31.3 31.6  RDW 13.2 13.2    Chemistries  Recent Labs  Lab 06/21/18 1920 06/22/18 0229 06/22/18 0820  NA 138  --  139  K 3.2*  --  3.4*  CL 101  --  104  CO2 26  --  26  GLUCOSE 122*  --  112*  BUN 16  --  13  CREATININE 0.87  --  0.75   CALCIUM 9.2  --  8.8*  MG  --  1.6*  --   AST  --   --  33  ALT  --   --  20  ALKPHOS  --   --  99  BILITOT  --   --  0.8   ------------------------------------------------------------------------------------------------------------------ No results for input(s): CHOL, HDL, LDLCALC, TRIG, CHOLHDL, LDLDIRECT in the last 72 hours.  No results found for: HGBA1C ------------------------------------------------------------------------------------------------------------------ No results for input(s): TSH, T4TOTAL, T3FREE, THYROIDAB in the last 72 hours.  Invalid input(s): FREET3 ------------------------------------------------------------------------------------------------------------------ No results for input(s): VITAMINB12, FOLATE, FERRITIN, TIBC, IRON, RETICCTPCT in the last 72 hours.  Coagulation profile No results for input(s): INR, PROTIME in the last 168 hours.  No results for input(s): DDIMER in the last 72 hours.  Cardiac Enzymes Recent Labs  Lab 06/22/18 0229 06/22/18 0820  TROPONINI 0.18* 0.14*   ------------------------------------------------------------------------------------------------------------------    Component Value Date/Time   BNP 178.2 (H) 06/21/2018 1920    Micro Results Recent Results (from the past 240 hour(s))  MRSA PCR Screening     Status: None   Collection Time: 06/22/18  2:01 AM  Result Value Ref Range Status   MRSA by PCR NEGATIVE NEGATIVE Final    Comment:        The GeneXpert MRSA Assay (FDA approved for NASAL specimens only), is one component of a comprehensive MRSA colonization surveillance program. It is not intended to diagnose MRSA infection nor to guide or monitor treatment for MRSA infections. Performed at [facility], [address], [place], [place] [address]     Radiology Reports Dg Chest 2 View  Result Date: 06/21/2018 CLINICAL DATA:  Short of breath EXAM: CHEST - 2 VIEW COMPARISON:  None. FINDINGS:  Bibasilar airspace disease. No pleural effusion. Heart size upper normal. No pneumothorax. IMPRESSION: Bibasilar airspace disease suspicious for pneumonia. Electronically Signed   By: Jasmine Pang M.D.   On: 06/21/2018 20:00   Ct Angio Chest Pe W/cm &/or Wo Cm  Result Date: 06/21/2018 CLINICAL DATA:  Chest pain with bilateral lower extremity swelling and tachycardia. EXAM: CT ANGIOGRAPHY CHEST WITH CONTRAST TECHNIQUE: Multidetector CT imaging of the chest was performed using the standard protocol during bolus administration of intravenous contrast. Multiplanar CT image reconstructions and MIPs were obtained to evaluate the vascular anatomy. CONTRAST:  ISOVUE-370 IOPAMIDOL (ISOVUE-370) INJECTION 76% COMPARISON:  None. FINDINGS: Cardiovascular: --Pulmonary arteries: Contrast injection is sufficient to demonstrate satisfactory opacification of the pulmonary arteries to the segmental level.There is technically a saddle embolus with a thin embolic strain and crossing the pulmonary arterial bifurcation. The bulk of the embolic burden is within the proximal right and left pulmonary arteries, extending into all lobar branches on the right and into the left lower lobar branches. There is relative sparing of the left upper lobe and lingula. There is borderline right heart strain with an RV/LV ratio of approximately 1. The main pulmonary artery is within normal limits for  size. --Aorta: Limited opacification of the aorta due to bolus timing optimization for the pulmonary arteries. Conventional 3 vessel aortic branching pattern. The aortic course and caliber are normal. There is no aortic atherosclerosis. --Heart: Normal size. No pericardial effusion. Mediastinum/Nodes: No mediastinal, hilar or axillary lymphadenopathy. The visualized thyroid and thoracic esophageal course are unremarkable. Lungs/Pleura: Multifocal subsegmental atelectasis. No consolidation or pulmonary infarct. No pleural effusion. Upper Abdomen:  Contrast bolus timing is not optimized for evaluation of the abdominal organs. Within this limitation, the visualized organs of the upper abdomen are normal. Musculoskeletal: No chest wall abnormality. No acute or significant osseous findings. Review of the MIP images confirms the above findings. IMPRESSION: 1. Saddle pulmonary embolus with bulk of the embolic burden in the proximal right and left pulmonary arteries with borderline CT evidence of right heart strain (RV/LV Ratio = 1.0) suggestive of submassive (intermediate risk) PE. The presence of right heart strain has been associated with an increased risk of morbidity and mortality. Please activate Code PE by paging [phone]. 2. No pleural effusion, pulmonary infarct or consolidation. Critical Value/emergent results were called by telephone at the time of interpretation on 06/21/2018 at 10:36 pm to [facility] , who verbally acknowledged these results. Electronically Signed   By: Deatra Robinson M.D.   On: 06/21/2018 22:41    Time Spent in minutes  30   Susa Raring M.D on 06/22/2018 at 11:30 AM  To page go to [email or web address] - password [facility]

## 2018-06-23 DIAGNOSIS — R0602 Shortness of breath: Secondary | ICD-10-CM | POA: Diagnosis not present

## 2018-06-23 DIAGNOSIS — I2690 Septic pulmonary embolism without acute cor pulmonale: Secondary | ICD-10-CM | POA: Diagnosis not present

## 2018-06-23 DIAGNOSIS — E876 Hypokalemia: Secondary | ICD-10-CM | POA: Diagnosis not present

## 2018-06-23 DIAGNOSIS — I2692 Saddle embolus of pulmonary artery without acute cor pulmonale: Secondary | ICD-10-CM | POA: Diagnosis not present

## 2018-06-23 LAB — BASIC METABOLIC PANEL
Anion gap: 10 (ref 5–15)
BUN: 19 mg/dL (ref 8–23)
CHLORIDE: 103 mmol/L (ref 98–111)
CO2: 25 mmol/L (ref 22–32)
CREATININE: 0.72 mg/dL (ref 0.44–1.00)
Calcium: 8.4 mg/dL — ABNORMAL LOW (ref 8.9–10.3)
GFR calc non Af Amer: >60 mL/min (ref >60)
Glucose, Bld: 118 mg/dL — ABNORMAL HIGH (ref 70–99)
Potassium: 3.8 mmol/L (ref 3.5–5.1)
SODIUM: 138 mmol/L (ref 135–145)

## 2018-06-23 LAB — MAGNESIUM: MAGNESIUM: 2 mg/dL (ref 1.7–2.4)

## 2018-06-23 LAB — CBC
HCT: 34.3 % — ABNORMAL LOW (ref 36.0–46.0)
HEMOGLOBIN: 10.8 g/dL — AB (ref 12.0–15.0)
MCH: 28.3 pg (ref 26.0–34.0)
MCHC: 31.5 g/dL (ref 30.0–36.0)
MCV: 89.8 fL (ref 78.0–100.0)
Platelets: 202 10*3/uL (ref 150–400)
RBC: 3.82 MIL/uL — ABNORMAL LOW (ref 3.87–5.11)
RDW: 13.3 % (ref 11.5–15.5)
WBC: 8.8 10*3/uL (ref 4.0–10.5)

## 2018-06-23 LAB — HEPARIN LEVEL (UNFRACTIONATED): HEPARIN UNFRACTIONATED: 0.29 [IU]/mL — AB (ref 0.30–0.70)

## 2018-06-23 MED ORDER — FUROSEMIDE 40 MG PO TABS
40.0000 mg | ORAL_TABLET | Freq: Once | ORAL | Status: AC
  Administered 2018-06-23: 40 mg via ORAL
  Filled 2018-06-23: qty 1

## 2018-06-23 MED ORDER — APIXABAN 5 MG PO TABS: 10.0000 mg | ORAL_TABLET | Freq: Two times a day (BID) | ORAL | 0 refills | Status: DC

## 2018-06-23 MED ORDER — APIXABAN 5 MG PO TABS: 5.0000 mg | ORAL_TABLET | Freq: Two times a day (BID) | ORAL | 0 refills | Status: DC

## 2018-06-23 MED ORDER — APIXABAN 5 MG PO TABS
10.0000 mg | ORAL_TABLET | Freq: Two times a day (BID) | ORAL | Status: DC
  Administered 2018-06-23: 10 mg via ORAL
  Filled 2018-06-23: qty 2

## 2018-06-23 MED ORDER — APIXABAN 5 MG PO TABS: 5.0000 mg | ORAL_TABLET | Freq: Two times a day (BID) | ORAL | Status: DC

## 2018-06-23 NOTE — Discharge Summary (Signed)
Angela Becker UJW:[id number] DOB: [birth year] DOA: 06/21/2018  PCP: Patient, No Pcp Per  Admit date: 06/21/2018  Discharge date: 06/23/2018  Admitted From: Home   Disposition:  Home   Recommendations for Outpatient Follow-up:   Follow up with PCP in 1-2 weeks  PCP Please obtain BMP/CBC, 2 view CXR in 1week,  (see Discharge instructions)   PCP Please follow up on the following pending results:    Home Health: None Equipment/Devices: None  Consultations: None Discharge Condition: Stable   CODE STATUS: Full   Diet Recommendation: Heart Healthy     Chief Complaint  Patient presents with  . Shortness of Breath     Brief history of present illness from the day of admission and additional interim summary    67 y.o.AA femalewith medical history significant of deafness, HTN, delusional disorder who has recent history of travel to [place] 1 month ago came to the hospital with chief complaints of shortness of breath, she was diagnosed with large saddle PE and admitted for further treatment.                                                                 Hospital Course    1.  Shortness of breath caused by large saddle PE.  Positive troponin but no significant heart strain on the echocardiogram, she was maintained on heparin drip with good results, she also underwent lower extremity venous duplex showing extensive DVT in the left leg, with supportive care she is back to her baseline and symptom-free, hemodynamically stable, has been switched to Eliquis this morning, case management has been consulted to arrange for home Eliquis 30-day coupon thereafter per PCP.  Updated daughter, discharge home later this evening.  2.  Mild acute on chronic diastolic CHF.  EF 65%.  Does have 1+ edema left more than right leg, some likely  due to left lower extremity DVT as well.  Lungs are absolutely clear, single dose of oral Lasix here, continue with TED stockings upon discharge and follow with PCP.  3.  Hypokalemia.  Replaced orally and stable.  4. HX of delusions.  Outpatient psych follow-up.  No acute issues.  5.  Mild non-ACS pattern troponin rise.  Trend stable, due to right-sided stress caused by saddle PE.  Treatment as a #1 above.    Discharge diagnosis     Active Problems:   Pulmonary embolus (HCC)   Hypokalemia   Pulmonary embolism (HCC)   SOB (shortness of breath)    Discharge instructions    Discharge Instructions    Diet - low sodium heart healthy   Complete by:  As directed    Discharge instructions   Complete by:  As directed    Follow with Primary MD in 7 days   Get CBC, CMP, 2 view Chest  X ray checked  by Primary MD  in 5-7 days   Activity: As tolerated with Full fall precautions use walker/cane & assistance as needed  Disposition Home    Diet:  Heart Healthy      Special Instructions: If you have smoked or chewed Tobacco  in the last 2 yrs please stop smoking, stop any regular Alcohol  and or any Recreational drug use.  On your next visit with your primary care physician please Get Medicines reviewed and adjusted.  Please request your Prim.MD to go over all Hospital Tests and Procedure/Radiological results at the follow up, please get all Hospital records sent to your Prim MD by signing hospital release before you go home.  If you experience worsening of your admission symptoms, develop shortness of breath, life threatening emergency, suicidal or homicidal thoughts you must seek medical attention immediately by calling 911 or calling your MD immediately  if symptoms less severe.  You Must read complete instructions/literature along with all the possible adverse reactions/side effects for all the Medicines you take and that have been prescribed to you. Take any new Medicines after you  have completely understood and accpet all the possible adverse reactions/side effects.   Increase activity slowly   Complete by:  As directed       Discharge Medications   Allergies as of 06/23/2018      Reactions   Penicillins       Medication List    TAKE these medications   amLODipine 10 MG tablet Commonly known as:  NORVASC Take 10 mg by mouth daily.   apixaban 5 MG Tabs tablet Commonly known as:  ELIQUIS Take 2 tablets (10 mg total) by mouth 2 (two) times daily for 6 days.   apixaban 5 MG Tabs tablet Commonly known as:  ELIQUIS Take 1 tablet (5 mg total) by mouth 2 (two) times daily. Start taking on:  06/30/2018   COMBIGAN 0.2-0.5 % ophthalmic solution Generic drug:  brimonidine-timolol Place 1 drop into the right eye 2 (two) times daily.   DUREZOL OP Place 1 drop into the right eye 3 (three) times daily.   haloperidol 5 MG tablet Commonly known as:  HALDOL Take 5 mg by mouth at bedtime.       Follow-up Information    Leilani Able, MD. Schedule an appointment as soon as possible for a visit in 1 week(s).   Specialty:  Family Medicine Contact information: [address] [place] [address] [phone]           Major procedures and Radiology Reports - PLEASE review detailed and final reports thoroughly  -     Lower extremity venous duplex.  There is evidence of acute deep vein thrombosis involving the femoral, popliteal, and intramuscular gastrocnemius veins of the left lower extremity.  Negative for DVT on the right.    TTE   Left ventricle: The cavity size was normal. There was mild focal basal hypertrophy of the septum. Systolic function was normal. The estimated ejection fraction was in the range of 60% to 65%. Wall motion was normal; there were no regional wall motion abnormalities. Doppler parameters are consistent with abnormal left ventricular relaxation (grade 1 diastolic dysfunction). - Aortic valve: There was no regurgitation. -  Mitral valve: There was no regurgitation. - Right ventricle: The cavity size was mildly dilated. Wall thickness was normal. Systolic function was normal. - Right atrium: The atrium was normal in size. - Pulmonary arteries: Systolic pressure was at the upper  limits of normal. PA peak pressure: 31 mm Hg (S). - Inferior vena cava: The vessel was dilated. The respirophasicdiameter changes were blunted (<50%), consistent with elevatedcentral venous pressure. - Pericardium, extracardiac: There was no pericardial effusion.  Impressions:   No signs of significant RV strain, RVSP at the upper normallevel.   Dg Chest 2 View  Result Date: 06/21/2018 CLINICAL DATA:  Short of breath EXAM: CHEST - 2 VIEW COMPARISON:  None. FINDINGS: Bibasilar airspace disease. No pleural effusion. Heart size upper normal. No pneumothorax. IMPRESSION: Bibasilar airspace disease suspicious for pneumonia. Electronically Signed   By: Jasmine Pang M.D.   On: 06/21/2018 20:00   Ct Angio Chest Pe W/cm &/or Wo Cm  Result Date: 06/21/2018 CLINICAL DATA:  Chest pain with bilateral lower extremity swelling and tachycardia. EXAM: CT ANGIOGRAPHY CHEST WITH CONTRAST TECHNIQUE: Multidetector CT imaging of the chest was performed using the standard protocol during bolus administration of intravenous contrast. Multiplanar CT image reconstructions and MIPs were obtained to evaluate the vascular anatomy. CONTRAST:  ISOVUE-370 IOPAMIDOL (ISOVUE-370) INJECTION 76% COMPARISON:  None. FINDINGS: Cardiovascular: --Pulmonary arteries: Contrast injection is sufficient to demonstrate satisfactory opacification of the pulmonary arteries to the segmental level.There is technically a saddle embolus with a thin embolic strain and crossing the pulmonary arterial bifurcation. The bulk of the embolic burden is within the proximal right and left pulmonary arteries, extending into all lobar branches on the right and into the left lower lobar branches. There  is relative sparing of the left upper lobe and lingula. There is borderline right heart strain with an RV/LV ratio of approximately 1. The main pulmonary artery is within normal limits for size. --Aorta: Limited opacification of the aorta due to bolus timing optimization for the pulmonary arteries. Conventional 3 vessel aortic branching pattern. The aortic course and caliber are normal. There is no aortic atherosclerosis. --Heart: Normal size. No pericardial effusion. Mediastinum/Nodes: No mediastinal, hilar or axillary lymphadenopathy. The visualized thyroid and thoracic esophageal course are unremarkable. Lungs/Pleura: Multifocal subsegmental atelectasis. No consolidation or pulmonary infarct. No pleural effusion. Upper Abdomen: Contrast bolus timing is not optimized for evaluation of the abdominal organs. Within this limitation, the visualized organs of the upper abdomen are normal. Musculoskeletal: No chest wall abnormality. No acute or significant osseous findings. Review of the MIP images confirms the above findings. IMPRESSION: 1. Saddle pulmonary embolus with bulk of the embolic burden in the proximal right and left pulmonary arteries with borderline CT evidence of right heart strain (RV/LV Ratio = 1.0) suggestive of submassive (intermediate risk) PE. The presence of right heart strain has been associated with an increased risk of morbidity and mortality. Please activate Code PE by paging [phone]. 2. No pleural effusion, pulmonary infarct or consolidation. Critical Value/emergent results were called by telephone at the time of interpretation on 06/21/2018 at 10:36 pm to [facility] , who verbally acknowledged these results. Electronically Signed   By: Deatra Robinson M.D.   On: 06/21/2018 22:41    Micro Results     Recent Results (from the past 240 hour(s))  MRSA PCR Screening     Status: None   Collection Time: 06/22/18  2:01 AM  Result Value Ref Range Status   MRSA by PCR NEGATIVE  NEGATIVE Final    Comment:        The GeneXpert MRSA Assay (FDA approved for NASAL specimens only), is one component of a comprehensive MRSA colonization surveillance program. It is not intended to diagnose MRSA infection nor to guide or  monitor treatment for MRSA infections. Performed at [facility], [address] [place], [place] [address]     Today   Subjective    Lillie Portner today has no headache,no chest abdominal pain, no SOB.   Objective   Blood pressure 115/75, pulse 82, temperature 98.2 F (36.8 C), temperature source Oral, resp. rate 18, height 5\' 6"  (1.676 m), weight 60.9 kg, SpO2 99 %.   Intake/Output Summary (Last 24 hours) at 06/23/2018 1145 Last data filed at 06/23/2018 0921 Gross per 24 hour  Intake 831.9 ml  Output 450 ml  Net 381.9 ml    Exam  Awake Alert,  No new F.N deficits, Normal affect [place].AT,PERRAL Supple Neck,No JVD, No cervical lymphadenopathy appriciated.  Symmetrical Chest wall movement, Good air movement bilaterally, CTAB RRR,No Gallops,Rubs or new Murmurs, No Parasternal Heave +ve B.Sounds, Abd Soft, Non tender, No organomegaly appriciated, No rebound -guarding or rigidity. No Cyanosis, Clubbing or edema, No new Rash or bruise   Data Review   CBC w Diff:  Lab Results  Component Value Date   WBC 8.8 06/23/2018   HGB 10.8 (L) 06/23/2018   HCT 34.3 (L) 06/23/2018   PLT 202 06/23/2018    CMP:  Lab Results  Component Value Date   NA 138 06/23/2018   K 3.8 06/23/2018   CL 103 06/23/2018   CO2 25 06/23/2018   BUN 19 06/23/2018   CREATININE 0.72 06/23/2018   PROT 7.2 06/22/2018   ALBUMIN 3.1 (L) 06/22/2018   BILITOT 0.8 06/22/2018   ALKPHOS 99 06/22/2018   AST 33 06/22/2018   ALT 20 06/22/2018  .   Total Time in preparing paper work, data evaluation and todays exam - 35 minutes  Susa Raring M.D on 06/23/2018 at 11:45 AM  [facility]   Office  [phone]

## 2018-06-23 NOTE — Plan of Care (Signed)
Discussed plan of care for the evening with patient's daughter.  Emphasized the importance of using the call bell when assistance is needed.  Minimal teach back displayed.

## 2018-06-23 NOTE — Progress Notes (Addendum)
ANTICOAGULATION CONSULT NOTE - Follow Up Consult  Pharmacy Consult for heparin > apixaban Indication: pulmonary embolus  Labs: Recent Labs    06/21/18 1920 06/22/18 0229 06/22/18 0820 06/23/18 0320  HGB 12.3  --  11.3* 10.8*  HCT 39.3  --  35.8* 34.3*  PLT 183  --  186 202  HEPARINUNFRC  --  0.53 0.43 0.29*  CREATININE 0.87  --  0.75 0.72  TROPONINI  --  0.18* 0.14*  --     Assessment: 67yo female now subtherapeutic on heparin despite rate increase yesterday to maintain therapeutic level; RN reports no gtt issues; Hgb trending down but RN reports no overt signs of bleeding.  Goal of Therapy:  Heparin level 0.3-0.7 units/ml   Plan:  Will increase heparin gtt by 1-2 units/kg/hr to 1250 units/hr and check level in 6 hours.    Vernard Gambles, PharmD, BCPS  06/23/2018,5:13 AM   Addendum: Now to transition from heparin to apixaban.  Will start apixaban 10mg  BID x1wk followed by 5mg  BID and begin education. VB 7:20 AM

## 2018-06-23 NOTE — Care Management Note (Addendum)
Case Management Note  Patient Details  Name: Angela Becker MRN: [id number] Date of Birth: 1951-06-22  Subjective/Objective:  Patient is deaf, from home, presents with PE was on hep drip,now off switched to eliquis, NCM gave patient the eliquis 30 day savings coupon card.  And her copay with Medicaid is 3.50. PT eval orders. Awaiting recs.  PCP is [facility].   Per pt eval rec HHPT, NCM spoke with patient daughter, Angela Becker  , patient states ok to talk with her.  [name] chose [facility] for HHPT and rolling walker.  Referral given to [facility] with [facility] . Soc will begin 24-48 hrs post dc.                                    Action/Plan: DC home with [facility] services when ready.  Expected Discharge Date:  06/23/18               Expected Discharge Plan:  Home/Self Care  In-House Referral:     Discharge planning Services  CM Consult, Medication Assistance  Post Acute Care Choice:  Durable Medical Equipment, Home Health Choice offered to:  Adult Children  DME Arranged:  Walker rolling DME Agency:  [facility]  HH Arranged:  PT, RN [facility] Agency:  [facility]  Status of Service:  Completed, signed off  If discussed at Long Length of Stay Meetings, dates discussed:    Additional Comments:  Leone Haven, RN 06/23/2018, 12:30 PM

## 2018-06-23 NOTE — ED Provider Notes (Signed)
[place] [facility] Provider Note   CSN: [id number] Arrival date & time: 06/21/18  1846     History   Chief Complaint Chief Complaint  Patient presents with  . Shortness of Breath    HPI Angela Becker is a 67 y.o. female.  HPI Patient presents to the emergency department with increasing shortness of breath over the last 2 days.  The daughter states that she seems like she was having some wheezing.  Daughter also states that she seems to be having some difficulty with breathing.  The daughter is giving me the history because the patient is deaf.  The patient denies chest pain, headache,blurred vision, neck pain, fever, cough, weakness, numbness, dizziness, anorexia, edema, abdominal pain, nausea, vomiting, diarrhea, rash, back pain, dysuria, hematemesis, bloody stool, near syncope, or syncope.  States she did seem to have some pain in the lower ribs on the right and left. Past Medical History:  Diagnosis Date  . Hypertension     Patient Active Problem List   Diagnosis Date Noted  . Pulmonary embolus ([place]) 06/22/2018  . Hypokalemia 06/22/2018  . Pulmonary embolism ([place]) 06/22/2018    History reviewed. No pertinent surgical history.   OB History   None      Home Medications    Prior to Admission medications   Medication Sig Start Date End Date Taking? Authorizing Provider  amLODipine (NORVASC) 10 MG tablet Take 10 mg by mouth daily.   Yes [provider]  brimonidine-timolol (COMBIGAN) 0.2-0.5 % ophthalmic solution Place 1 drop into the right eye 2 (two) times daily.   Yes [provider]  Difluprednate (DUREZOL OP) Place 1 drop into the right eye 3 (three) times daily.   Yes [provider]  haloperidol (HALDOL) 5 MG tablet Take 5 mg by mouth at bedtime.   Yes [provider]    Family History Family History  Problem Relation Age of Onset  . CAD Mother   . Hypertension Mother   . Clotting disorder Sister   .  Multiple myeloma Sister   . Diabetes Sister   . CAD Sister   . Hypertension Sister   . Diabetes Other     Social History Social History   Tobacco Use  . Smoking status: Never Smoker  . Smokeless tobacco: Never Used  Substance Use Topics  . Alcohol use: Not on file  . Drug use: Not on file     Allergies   Penicillins   Review of Systems Review of Systems  All other systems negative except as documented in the HPI. All pertinent positives and negatives as reviewed in the HPI. Physical Exam Updated Vital Signs BP 117/71 (BP Location: Left Arm)   Pulse 91   Temp 98.2 F (36.8 C) (Oral)   Resp 17   Ht '5\' 6"'  (1.676 m)   Wt 60.9 kg   SpO2 100%   BMI 21.67 kg/m   Physical Exam  Constitutional: She is oriented to person, place, and time. She appears well-developed and well-nourished. No distress.  HENT:  Head: Normocephalic and atraumatic.  Mouth/Throat: Oropharynx is clear and moist.  Eyes: Pupils are equal, round, and reactive to light.  Neck: Normal range of motion. Neck supple.  Cardiovascular: Normal rate, regular rhythm and normal heart sounds. Exam reveals no gallop and no friction rub.  No murmur heard. Pulmonary/Chest: Effort normal. No respiratory distress. She has decreased breath sounds in the right lower field and the left lower field. She has  no wheezes. She has no rhonchi. She has no rales.  Abdominal: Soft. Bowel sounds are normal. She exhibits no distension. There is no tenderness.  Neurological: She is alert and oriented to person, place, and time. She exhibits normal muscle tone. Coordination normal.  Skin: Skin is warm and dry. Capillary refill takes less than 2 seconds. No rash noted. No erythema.  Psychiatric: She has a normal mood and affect. Her behavior is normal.  Nursing note and vitals reviewed.    ED Treatments / Results  Labs (all labs ordered are listed, but only abnormal results are displayed) Labs Reviewed  BASIC METABOLIC PANEL -  Abnormal; Notable for the following components:      Result Value   Potassium 3.2 (*)    Glucose, Bld 122 (*)    All other components within normal limits  CBC - Abnormal; Notable for the following components:   Hemoglobin 11.3 (*)    HCT 35.8 (*)    All other components within normal limits  BRAIN NATRIURETIC PEPTIDE - Abnormal; Notable for the following components:   B Natriuretic Peptide 178.2 (*)    All other components within normal limits  TROPONIN I - Abnormal; Notable for the following components:   Troponin I 0.18 (*)    All other components within normal limits  TROPONIN I - Abnormal; Notable for the following components:   Troponin I 0.14 (*)    All other components within normal limits  MAGNESIUM - Abnormal; Notable for the following components:   Magnesium 1.6 (*)    All other components within normal limits  COMPREHENSIVE METABOLIC PANEL - Abnormal; Notable for the following components:   Potassium 3.4 (*)    Glucose, Bld 112 (*)    Calcium 8.8 (*)    Albumin 3.1 (*)    All other components within normal limits  I-STAT TROPONIN, ED - Abnormal; Notable for the following components:   Troponin i, poc 0.37 (*)    All other components within normal limits  MRSA PCR SCREENING  CBC  HEPARIN LEVEL (UNFRACTIONATED)  HEPARIN LEVEL (UNFRACTIONATED)  URINALYSIS, ROUTINE W REFLEX MICROSCOPIC  HEPARIN LEVEL (UNFRACTIONATED)  CBC  BASIC METABOLIC PANEL  MAGNESIUM  I-STAT CG4 LACTIC ACID, ED  I-STAT CG4 LACTIC ACID, ED    EKG EKG Interpretation  Date/Time:  Wednesday June 21 2018 22:49:57 EDT Ventricular Rate:  108 PR Interval:  128 QRS Duration: 80 QT Interval:  317 QTC Calculation: 425 R Axis:   101 Text Interpretation:  Sinus tachycardia Right axis deviation Nonspecific T abnormalities, lateral leads No significant change since last tracing earlier in the day  Confirmed by Wandra Arthurs ([id number]) on 06/22/2018 7:32:59 PM   Radiology Dg Chest 2 View  Result  Date: 06/21/2018 CLINICAL DATA:  Short of breath EXAM: CHEST - 2 VIEW COMPARISON:  None. FINDINGS: Bibasilar airspace disease. No pleural effusion. Heart size upper normal. No pneumothorax. IMPRESSION: Bibasilar airspace disease suspicious for pneumonia. Electronically Signed   By: Donavan Foil M.D.   On: 06/21/2018 20:00   Ct Angio Chest Pe W/cm &/or Wo Cm  Result Date: 06/21/2018 CLINICAL DATA:  Chest pain with bilateral lower extremity swelling and tachycardia. EXAM: CT ANGIOGRAPHY CHEST WITH CONTRAST TECHNIQUE: Multidetector CT imaging of the chest was performed using the standard protocol during bolus administration of intravenous contrast. Multiplanar CT image reconstructions and MIPs were obtained to evaluate the vascular anatomy. CONTRAST:  138m ISOVUE-370 IOPAMIDOL (ISOVUE-370) INJECTION 76% COMPARISON:  None. FINDINGS: Cardiovascular: --Pulmonary arteries: Contrast  injection is sufficient to demonstrate satisfactory opacification of the pulmonary arteries to the segmental level.There is technically a saddle embolus with a thin embolic strain and crossing the pulmonary arterial bifurcation. The bulk of the embolic burden is within the proximal right and left pulmonary arteries, extending into all lobar branches on the right and into the left lower lobar branches. There is relative sparing of the left upper lobe and lingula. There is borderline right heart strain with an RV/LV ratio of approximately 1. The main pulmonary artery is within normal limits for size. --Aorta: Limited opacification of the aorta due to bolus timing optimization for the pulmonary arteries. Conventional 3 vessel aortic branching pattern. The aortic course and caliber are normal. There is no aortic atherosclerosis. --Heart: Normal size. No pericardial effusion. Mediastinum/Nodes: No mediastinal, hilar or axillary lymphadenopathy. The visualized thyroid and thoracic esophageal course are unremarkable. Lungs/Pleura: Multifocal  subsegmental atelectasis. No consolidation or pulmonary infarct. No pleural effusion. Upper Abdomen: Contrast bolus timing is not optimized for evaluation of the abdominal organs. Within this limitation, the visualized organs of the upper abdomen are normal. Musculoskeletal: No chest wall abnormality. No acute or significant osseous findings. Review of the MIP images confirms the above findings. IMPRESSION: 1. Saddle pulmonary embolus with bulk of the embolic burden in the proximal right and left pulmonary arteries with borderline CT evidence of right heart strain (RV/LV Ratio = 1.0) suggestive of submassive (intermediate risk) PE. The presence of right heart strain has been associated with an increased risk of morbidity and mortality. Please activate Code PE by paging [phone]. 2. No pleural effusion, pulmonary infarct or consolidation. Critical Value/emergent results were called by telephone at the time of interpretation on 06/21/2018 at 10:36 pm to [facility] , who verbally acknowledged these results. Electronically Signed   By: Ulyses Jarred M.D.   On: 06/21/2018 22:41    Procedures Procedures (including critical care time)  Medications Ordered in ED Medications  iopamidol (ISOVUE-370) 76 % injection (has no administration in time range)  heparin ADULT infusion 100 units/mL (25000 units/221m sodium chloride 0.45%) (1,150 Units/hr Intravenous New Bag/Given 06/22/18 1952)  acetaminophen (TYLENOL) tablet 650 mg (650 mg Oral Given 06/22/18 1234)  iopamidol (ISOVUE-370) 76 % injection 100 mL (100 mLs Intravenous Contrast Given 06/21/18 2216)  heparin bolus via infusion 3,700 Units (3,700 Units Intravenous Bolus from Bag 06/21/18 2316)  magnesium sulfate IVPB 2 g 50 mL (0 g Intravenous Stopped 06/22/18 1147)  potassium chloride SA (K-DUR,KLOR-CON) CR tablet 40 mEq (40 mEq Oral Given 06/22/18 1234)     Initial Impression / Assessment and Plan / ED Course  I have reviewed the triage vital signs and the  nursing notes.  Pertinent labs & imaging results that were available during my care of the patient were reviewed by me and considered in my medical decision making (see chart for details).     CRITICAL CARE Performed by: CResa MinerLawyer Total critical care time: 355mutes Critical care time was exclusive of separately billable procedures and treating other patients. Critical care was necessary to treat or prevent imminent or life-threatening deterioration. Critical care was time spent personally by me on the following activities: development of treatment plan with patient and/or surrogate as well as nursing, discussions with consultants, evaluation of patient's response to treatment, examination of patient, obtaining history from patient or surrogate, ordering and performing treatments and interventions, ordering and review of laboratory studies, ordering and review of radiographic studies, pulse oximetry and re-evaluation of patient's condition.  Has  bilateral saddle embolus and is started on heparin.  The patient's been tachycardic and was closely monitored due to this.  Patient does not show any signs of significant hypoxia at this time. Final Clinical Impressions(s) / ED Diagnoses   Final diagnoses:  SOB (shortness of breath)    ED Discharge Orders    None       Dalia Heading, PA-C 06/23/18 0019    Charlesetta Shanks, MD 07/03/18 1352

## 2018-06-23 NOTE — Progress Notes (Signed)
RN ambulated pt in hallway with walker. Pt sats 96% on RA. Pt tolerated well. RN gave DC instructions to pt and daughter, and they had no further questions. Pt was escorted to daughter's car by wheelchair and was stable upon DC.  Blane Ohara RN

## 2018-06-23 NOTE — Evaluation (Signed)
Physical Therapy Evaluation Patient Details Name: Angela Becker MRN: [id number] DOB: 01/28/1951 Today's Date: 06/23/2018   History of Present Illness  67 y.o. AA female with medical history significant of deafness, HTN,  delusional disorder who has recent history of travel to [place] 1 month ago came to the hospital with chief complaints of shortness of breath, she was diagnosed with large saddle PE and found to have DVT in B LE.  Clinical Impression  Pt evaluation completed with assistance of [employer] ASL interpreter Hannah #[id number]. Pt with difficulty following interpreter to provide clear information for home set up and PLOF. Daughter- [name] contacted and able to provide missing information. Pt limited in safe mobility by decreased safety and DME awareness, in addition to deconditioning resulting in decreased strength and balance. Pt currently requiring minA for bed mobility, minA for transfers with RW and min A-min guard assist with ambulation of 200 feet with RW. PT recommends HHPT at d/c to improve strength and balance for safe navigation in her home environment. Pt to be d/c today however PT will continue to follow if d/c does not occur.    Follow Up Recommendations Home health PT;Supervision/Assistance - 24 hour    Equipment Recommendations  Rolling walker with 5" wheels    Recommendations for Other Services       Precautions / Restrictions Precautions Precautions: Fall Restrictions Weight Bearing Restrictions: No      Mobility  Bed Mobility Overal bed mobility: Needs Assistance Bed Mobility: Supine to Sit     Supine to sit: Min assist     General bed mobility comments: minA for LE management to EoB and pas scoot of hips to EoB  Transfers Overall transfer level: Needs assistance Equipment used: Rolling walker (2 wheeled);1 person hand held assist Transfers: Sit to/from [employer] Sit to Stand: Mod assist;Min assist Stand pivot transfers: Mod assist        General transfer comment: modA for SPT from EoB<>BSC, minA for powerup and steadying in RW from EoB prior to ambulation, tactile cuing for hand placement for powerup to RW  Ambulation/Gait Ambulation/Gait assistance: Min assist;Min guard Gait Distance (Feet): 200 Feet Assistive device: Rolling walker (2 wheeled) Gait Pattern/deviations: Step-through pattern;Shuffle Gait velocity: slowed Gait velocity interpretation: <1.8 ft/sec, indicate of risk for recurrent falls General Gait Details: min A progressing to hands on min guard with ambulation, mild instability with gait however no over LoB, additional assist for navigating around obstacles on R side due to R eye blindness      Balance Overall balance assessment: Needs assistance Sitting-balance support: Feet supported;No upper extremity supported Sitting balance-Leahy Scale: Fair     Standing balance support: Bilateral upper extremity supported Standing balance-Leahy Scale: Poor Standing balance comment: requires RW support                             Pertinent Vitals/Pain Pain Assessment: Faces Faces Pain Scale: Hurts a little bit Pain Location: abdomen Pain Descriptors / Indicators: Discomfort Pain Intervention(s): Other (comment)(had been holding urine with urination reports feels better)    Home Living Family/patient expects to be discharged to:: Private residence Living Arrangements: Children Available Help at Discharge: Family;Available 24 hours/day Type of Home: House Home Access: Level entry     Home Layout: One level Home Equipment: None      Prior Function Level of Independence: Needs assistance   Gait / Transfers Assistance Needed: independent with community level ambulation  ADL's / Homemaking  Assistance Needed: daughter assists with bathing due to R eye blindness           Extremity/Trunk Assessment   Upper Extremity Assessment Upper Extremity Assessment: Overall WFL for tasks  assessed    Lower Extremity Assessment Lower Extremity Assessment: Generalized weakness(difficult to assess due to limited communication )       Communication   Communication: Deaf  Cognition Arousal/Alertness: Awake/alert Behavior During Therapy: WFL for tasks assessed/performed Overall Cognitive Status: Difficult to assess                                 General Comments: no family present, utilized ALS interpreter and difficult to get complete answers to questions and for command following      General Comments General comments (skin integrity, edema, etc.): Pt on 2L O2 via nasal cannula, SaO2 throughout session >95%O2, VSS        Assessment/Plan    PT Assessment Patient needs continued PT services  PT Problem List Decreased strength;Decreased activity tolerance;Decreased balance;Decreased mobility;Decreased cognition;Decreased safety awareness;Decreased knowledge of use of DME       PT Treatment Interventions DME instruction;Gait training;Functional mobility training;Therapeutic activities;Therapeutic exercise;Balance training;Cognitive remediation;Patient/family education    PT Goals (Current goals can be found in the Care Plan section)  Acute Rehab PT Goals Patient Stated Goal: none stated PT Goal Formulation: Patient unable to participate in goal setting Time For Goal Achievement: 07/07/18 Potential to Achieve Goals: Fair    Frequency Min 3X/week    AM-PAC PT "6 Clicks" Daily Activity  Outcome Measure Difficulty turning over in bed (including adjusting bedclothes, sheets and blankets)?: A Little Difficulty moving from lying on back to sitting on the side of the bed? : Unable Difficulty sitting down on and standing up from a chair with arms (e.g., wheelchair, bedside commode, etc,.)?: Unable Help needed moving to and from a bed to chair (including a wheelchair)?: A Little Help needed walking in hospital room?: A Little Help needed climbing 3-5  steps with a railing? : A Lot 6 Click Score: 13    End of Session Equipment Utilized During Treatment: Gait belt;Oxygen Activity Tolerance: Patient tolerated treatment well Patient left: in chair;with call bell/phone within reach;with chair alarm set Nurse Communication: Mobility status PT Visit Diagnosis: Unsteadiness on feet (R26.81);Other abnormalities of gait and mobility (R26.89);Muscle weakness (generalized) (M62.81);Difficulty in walking, not elsewhere classified (R26.2)    Time: 0981-1914 PT Time Calculation (min) (ACUTE ONLY): 46 min   Charges:   PT Evaluation $PT Eval Moderate Complexity: 1 Mod PT Treatments $Gait Training: 8-22 mins $Therapeutic Activity: 8-22 mins        Claretha Townshend B. Beverely Risen PT, DPT Acute Rehabilitation Services Pager [phone] Office [phone]   Elon Alas Fleet 06/23/2018, 1:14 PM

## 2018-06-23 NOTE — Care Management Note (Addendum)
Case Management Note  Patient Details  Name: Angela Becker MRN: [id number] Date of Birth: 06/06/1951  Subjective/Objective:  Patient is deaf, from home, presents with PE was on hep drip,now off switched to eliquis, NCM gave patient the eliquis 30 day savings coupon card.  And her copay with Medicaid is 3.50. PT eval orders. Awaiting recs.  PCP is [employer] [facility].                    Action/Plan: DC home when ready.  Expected Discharge Date:  06/23/18               Expected Discharge Plan:  Home/Self Care  In-House Referral:     Discharge planning Services  CM Consult, Medication Assistance  Post Acute Care Choice:    Choice offered to:     DME Arranged:   walker,rolling DME Agency:   [facility]  HH Arranged:   HHPT HH Agency:   [facility]  Status of Service:  Completed, signed off  If discussed at Long Length of Stay Meetings, dates discussed:    Additional Comments:  Leone Haven, RN 06/23/2018, 11:49 AM

## 2018-06-28 ENCOUNTER — Other Ambulatory Visit: Payer: Self-pay | Admitting: Family Medicine

## 2018-06-28 ENCOUNTER — Ambulatory Visit
Admission: RE | Admit: 2018-06-28 | Discharge: 2018-06-28 | Disposition: A | Discharge status: Home / Self Care | Payer: Medicare Other | Source: Ambulatory Visit | Attending: Family Medicine | Admitting: Family Medicine

## 2018-06-28 DIAGNOSIS — I2699 Other pulmonary embolism without acute cor pulmonale: Secondary | ICD-10-CM

## 2020-05-14 IMAGING — DX DG CHEST 2V
2 series · 2 of 2 positions shown · non-contrast
Comparison: None.

CLINICAL DATA: Short of breath

EXAM:
CHEST - 2 VIEW

[chest lat]
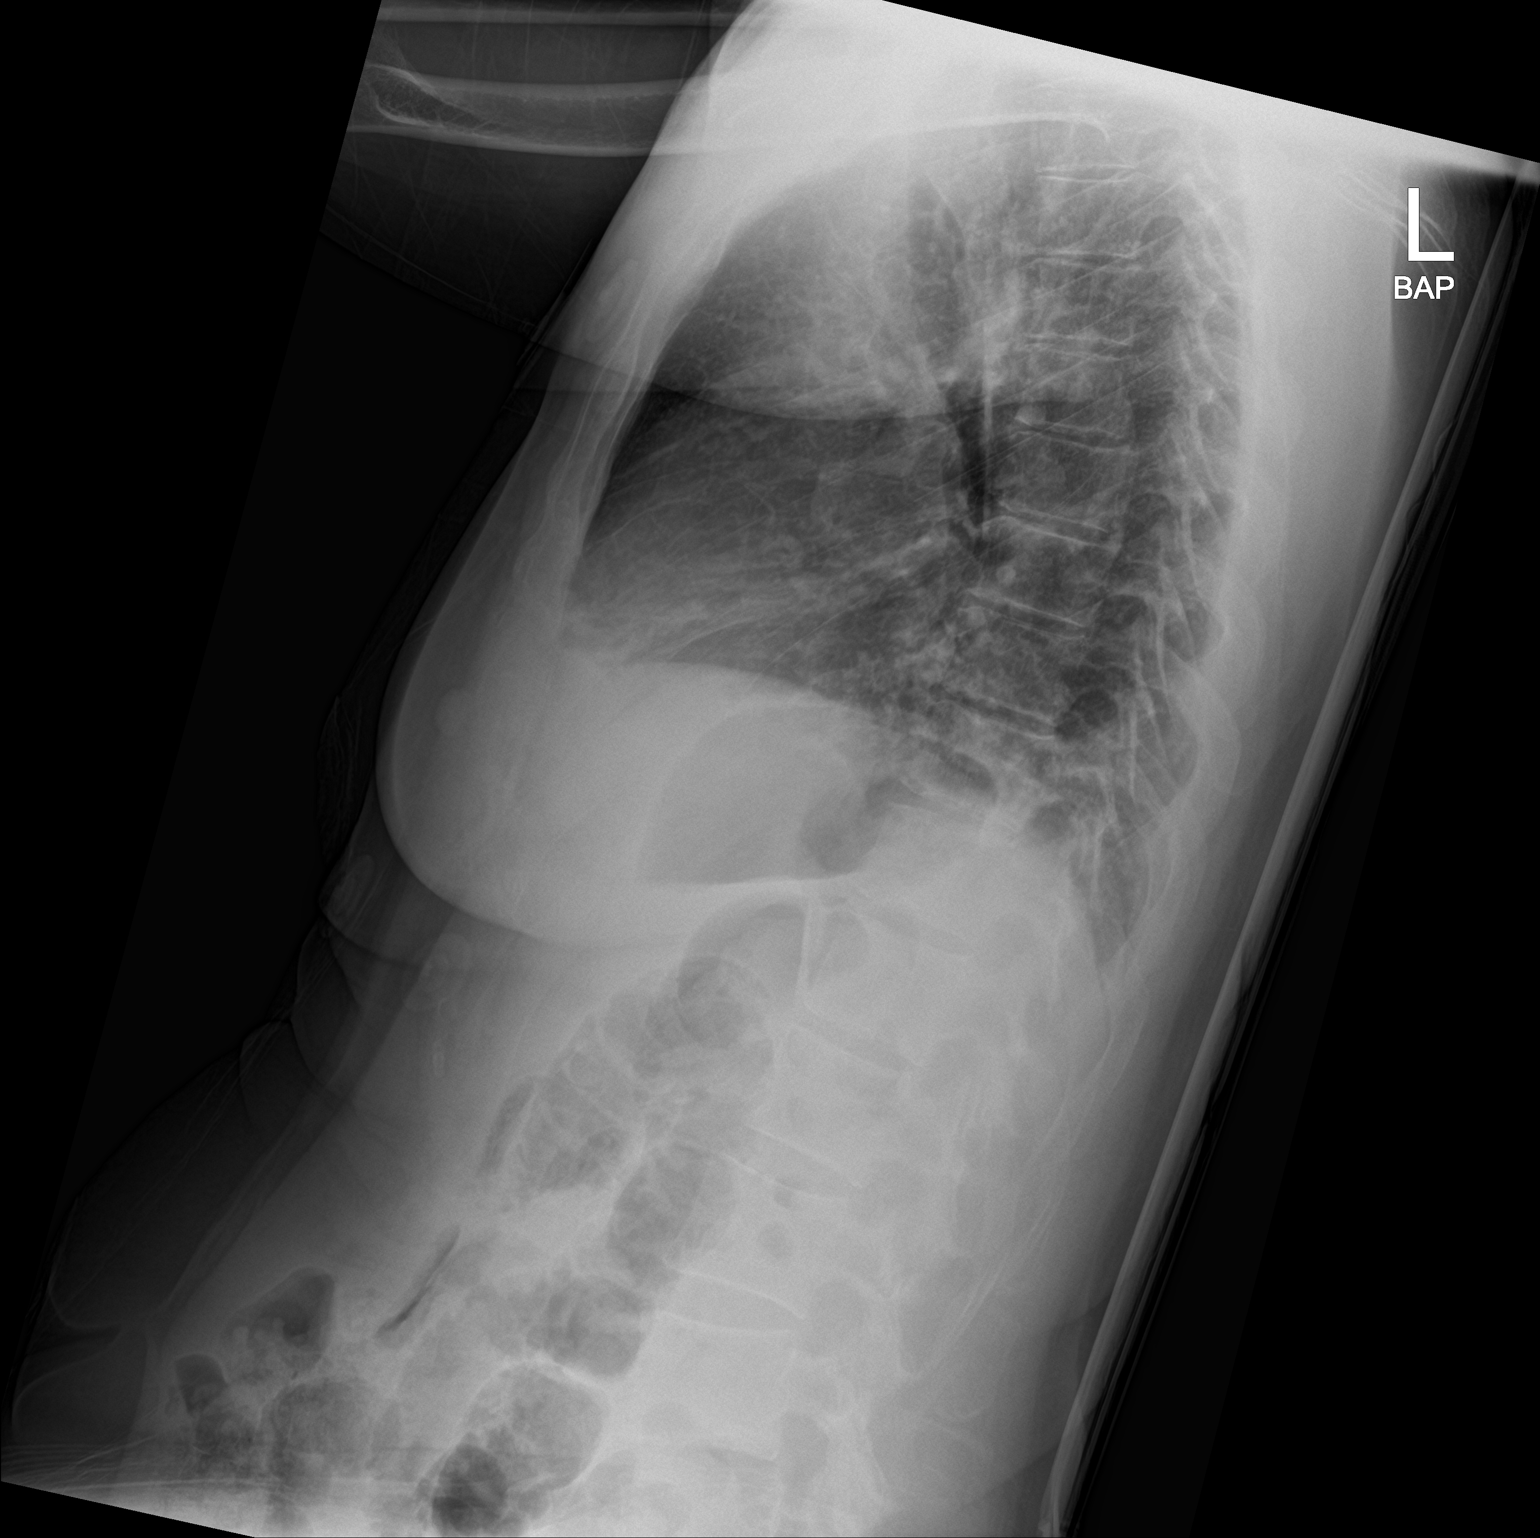

[chest ap]
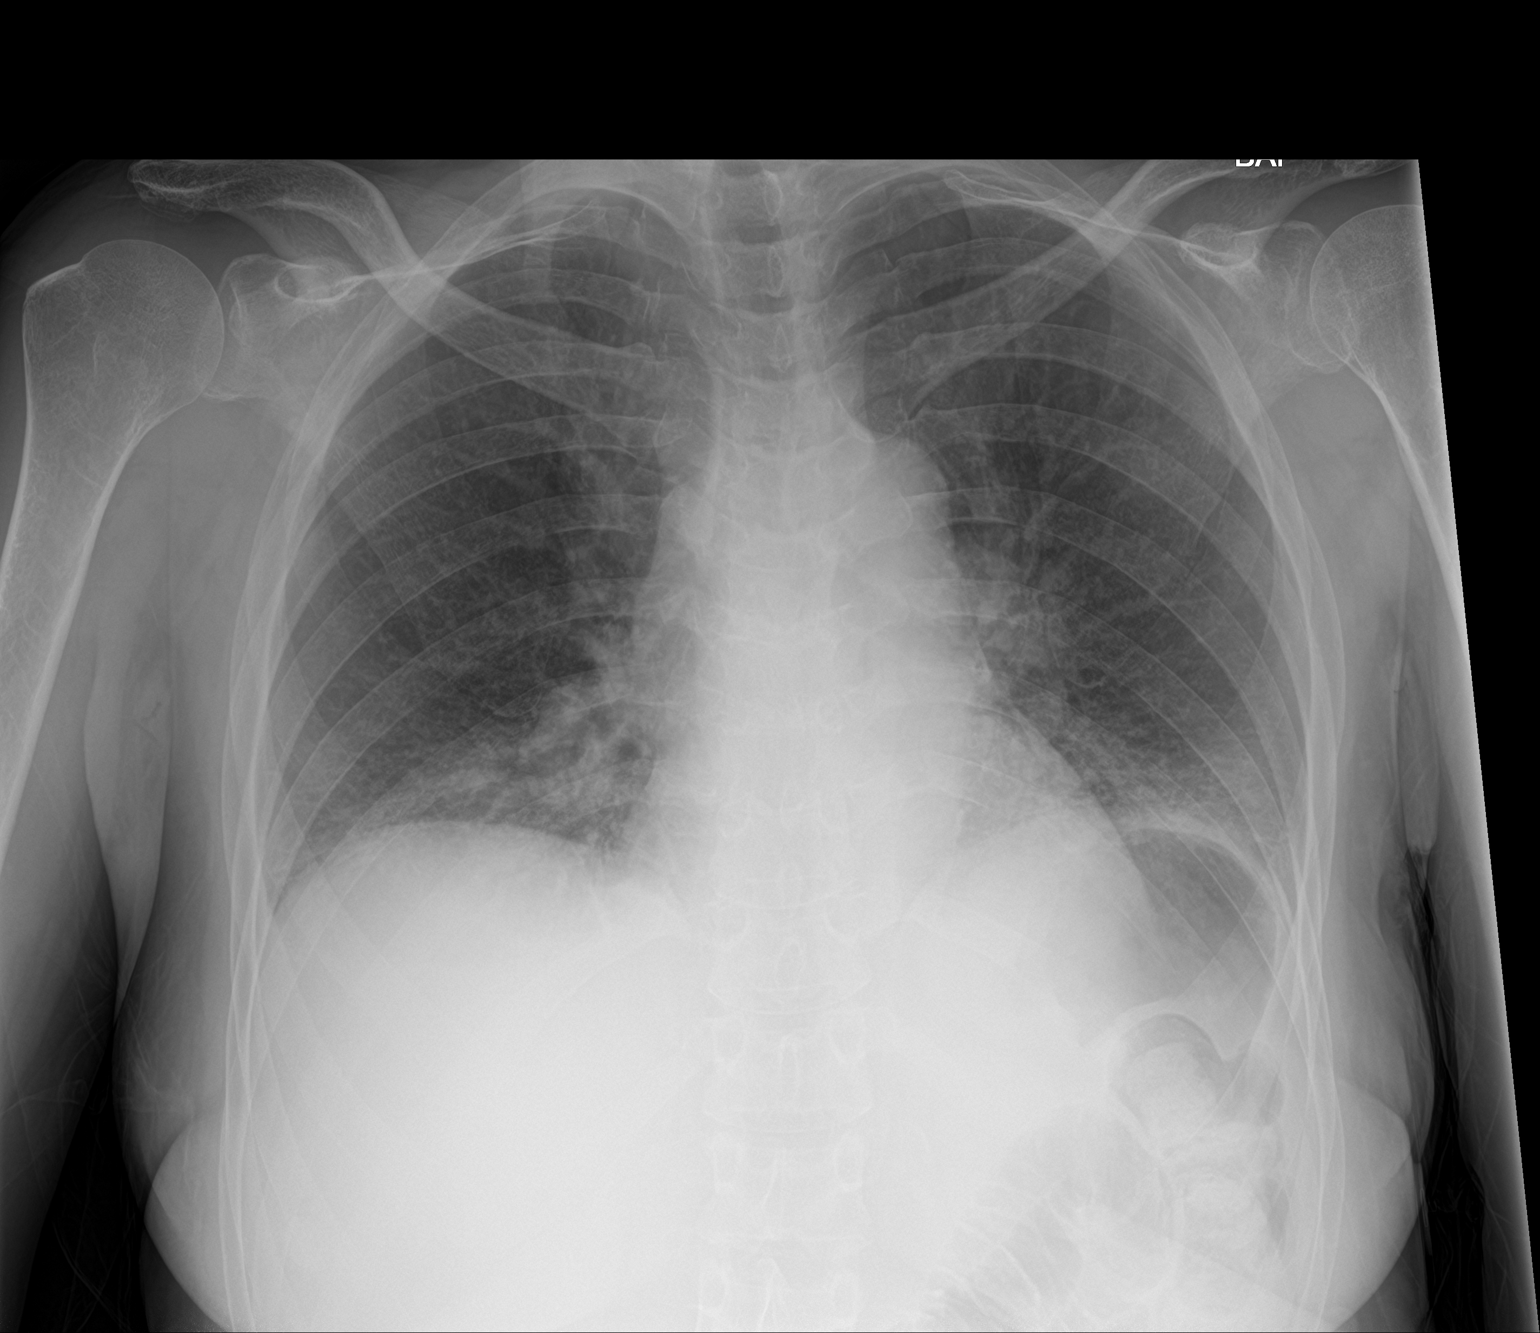

[2 of 2 positions shown; findings below may reference images not displayed]

FINDINGS: Bibasilar airspace disease. No pleural effusion. Heart size upper
normal. No pneumothorax.
IMPRESSION: Bibasilar airspace disease suspicious for pneumonia.

## 2020-05-21 IMAGING — CR DG CHEST 2V
2 series · 2 of 2 positions shown · non-contrast
Comparison: 06/21/2018

CLINICAL DATA: Shortness of breath and chest pain, history of large
pulmonary emboli

EXAM:
CHEST - 2 VIEW

[w chest pa]
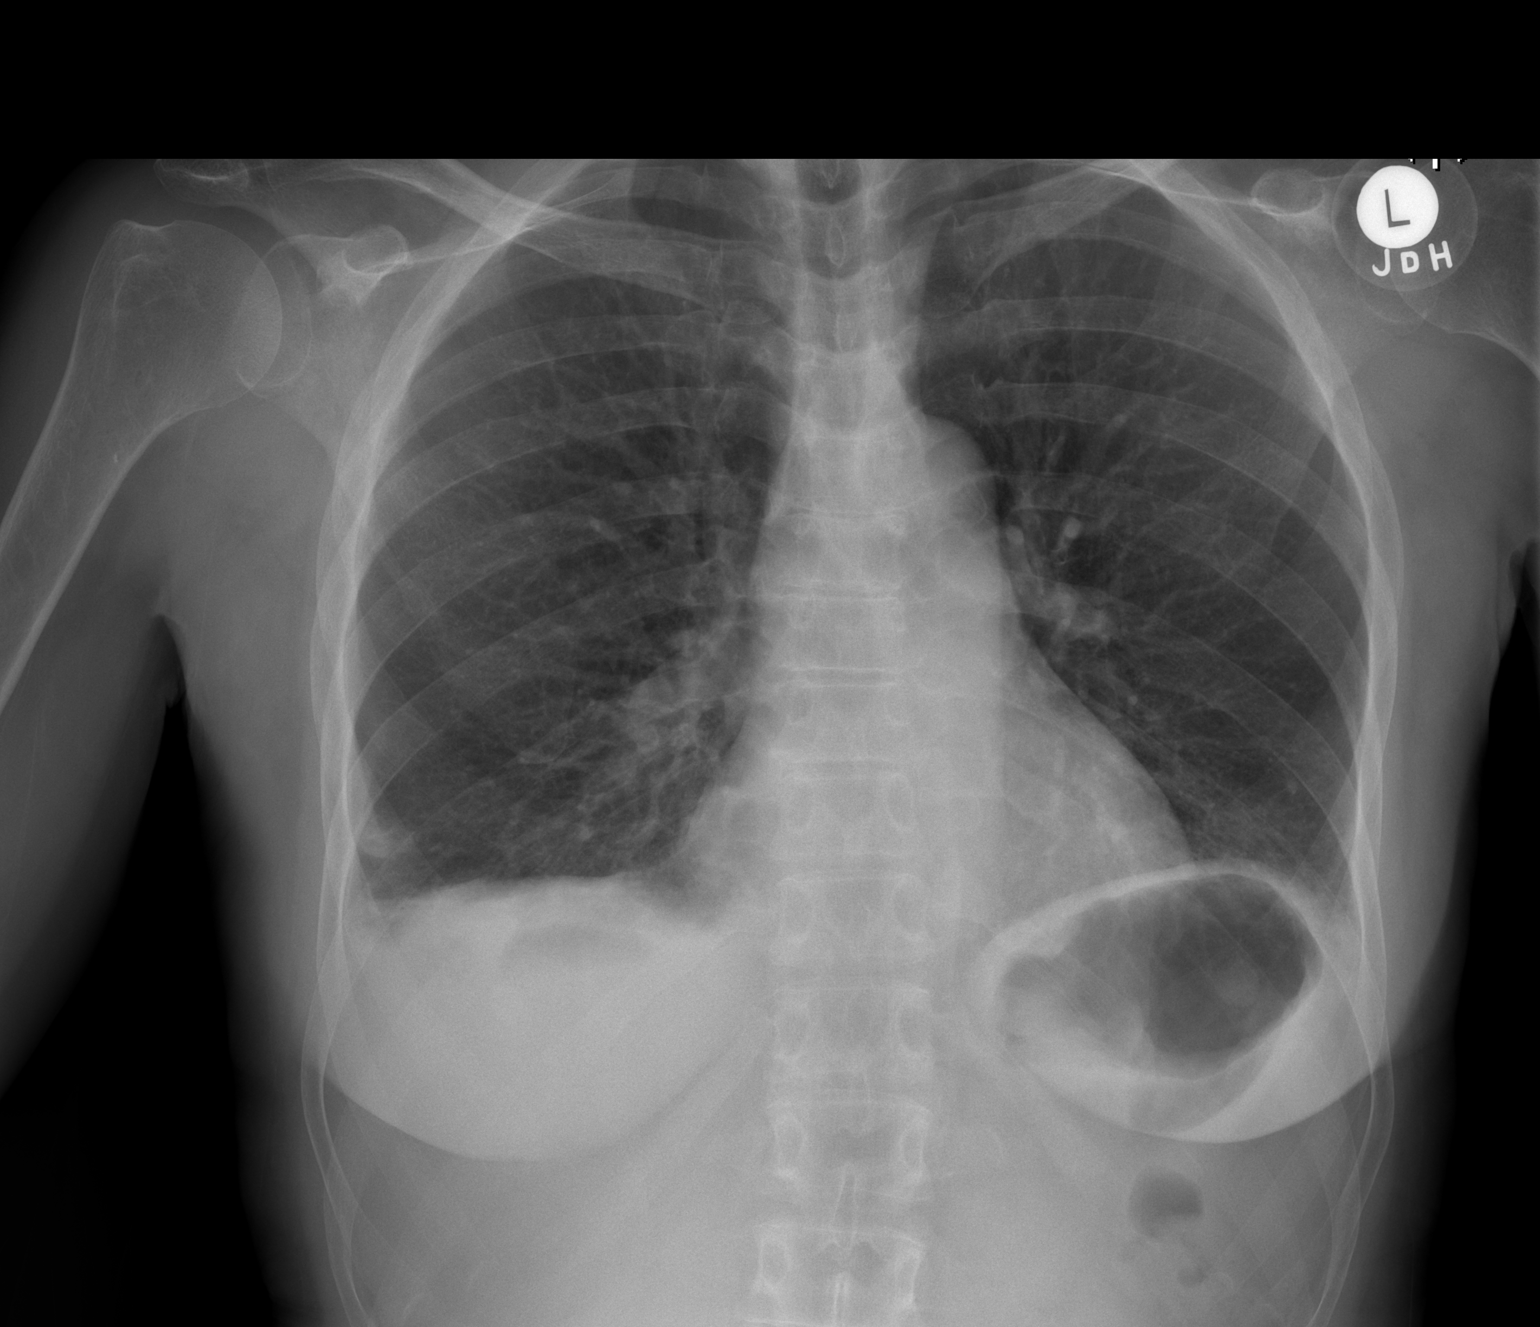

[w chest lat]
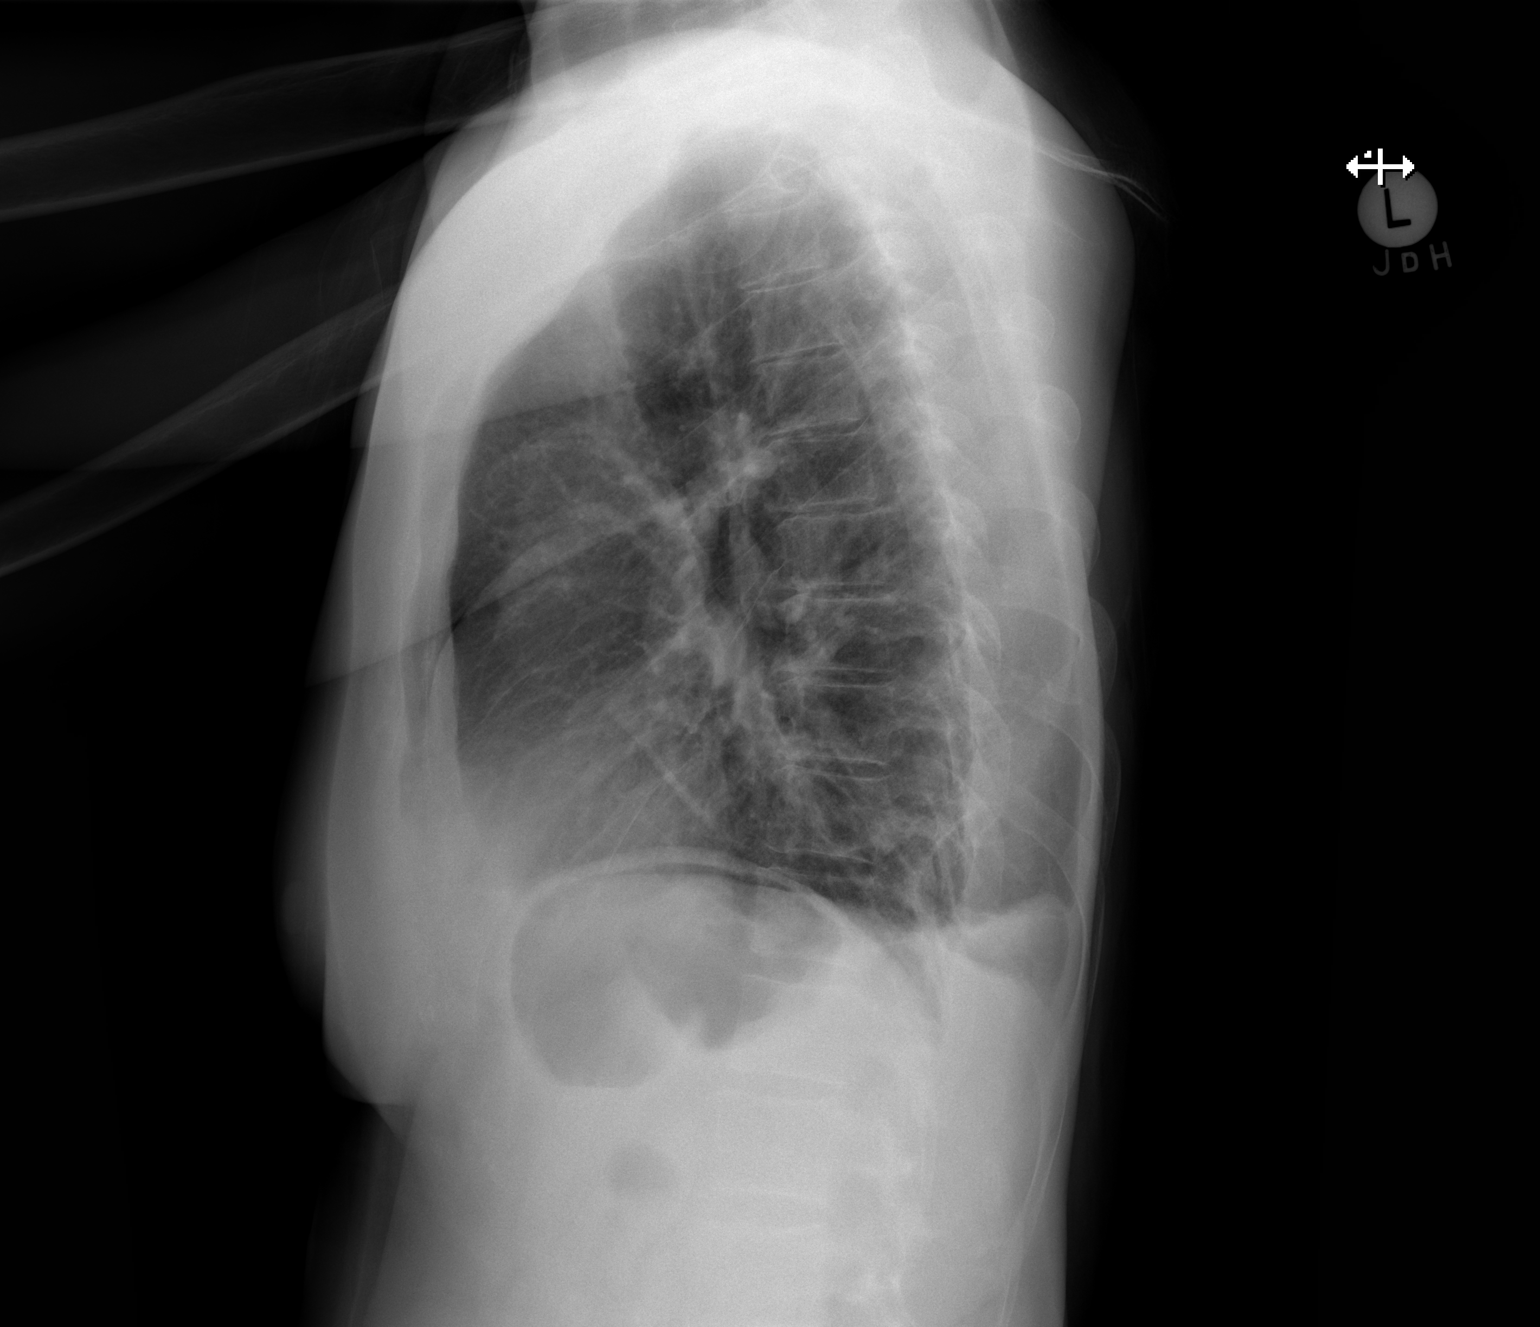

[2 of 2 positions shown; findings below may reference images not displayed]

FINDINGS: Cardiac shadow is stable. Previously seen bibasilar atelectatic
changes have improved although some persistent changes on the right
remain. No sizable effusion is seen. No bony abnormality is noted.
IMPRESSION: Improvement in bibasilar atelectasis although some mild persistent
changes on the right remain.

## 2022-08-30 ENCOUNTER — Other Ambulatory Visit: Payer: Self-pay | Admitting: Family Medicine

## 2022-08-30 DIAGNOSIS — Z1231 Encounter for screening mammogram for malignant neoplasm of breast: Secondary | ICD-10-CM

## 2022-10-28 ENCOUNTER — Ambulatory Visit
Admission: RE | Admit: 2022-10-28 | Discharge: 2022-10-28 | Disposition: A | Discharge status: Home / Self Care | Payer: Medicare Other | Source: Ambulatory Visit | Attending: Family Medicine | Admitting: Family Medicine

## 2022-10-28 DIAGNOSIS — Z1231 Encounter for screening mammogram for malignant neoplasm of breast: Secondary | ICD-10-CM

## 2022-11-02 ENCOUNTER — Other Ambulatory Visit: Payer: Self-pay | Admitting: Family Medicine

## 2022-11-02 DIAGNOSIS — R928 Other abnormal and inconclusive findings on diagnostic imaging of breast: Secondary | ICD-10-CM

## 2023-01-19 ENCOUNTER — Ambulatory Visit: Admission: RE | Admit: 2023-01-19 | Payer: Medicare Other | Source: Ambulatory Visit

## 2023-01-19 ENCOUNTER — Ambulatory Visit
Admission: RE | Admit: 2023-01-19 | Discharge: 2023-01-19 | Disposition: A | Discharge status: Home / Self Care | Payer: Medicare Other | Source: Ambulatory Visit | Attending: Family Medicine | Admitting: Family Medicine

## 2023-01-19 DIAGNOSIS — R928 Other abnormal and inconclusive findings on diagnostic imaging of breast: Secondary | ICD-10-CM

## 2023-12-13 ENCOUNTER — Emergency Department (HOSPITAL_COMMUNITY): Payer: Medicare Other

## 2023-12-13 ENCOUNTER — Encounter (HOSPITAL_COMMUNITY): Payer: Self-pay | Admitting: Emergency Medicine

## 2023-12-13 ENCOUNTER — Inpatient Hospital Stay (HOSPITAL_COMMUNITY)
Admission: EM | Admit: 2023-12-13 | Discharge: 2023-12-15 | DRG: 176 | Disposition: A | Discharge status: Home / Self Care | Payer: Medicare Other | Attending: Internal Medicine | Admitting: Internal Medicine

## 2023-12-13 ENCOUNTER — Other Ambulatory Visit: Payer: Self-pay

## 2023-12-13 DIAGNOSIS — Z79899 Other long term (current) drug therapy: Secondary | ICD-10-CM

## 2023-12-13 DIAGNOSIS — Z880 Allergy status to penicillin: Secondary | ICD-10-CM | POA: Diagnosis not present

## 2023-12-13 DIAGNOSIS — Z833 Family history of diabetes mellitus: Secondary | ICD-10-CM | POA: Diagnosis not present

## 2023-12-13 DIAGNOSIS — I10 Essential (primary) hypertension: Secondary | ICD-10-CM | POA: Diagnosis present

## 2023-12-13 DIAGNOSIS — N3090 Cystitis, unspecified without hematuria: Secondary | ICD-10-CM

## 2023-12-13 DIAGNOSIS — F22 Delusional disorders: Secondary | ICD-10-CM | POA: Diagnosis present

## 2023-12-13 DIAGNOSIS — Z7982 Long term (current) use of aspirin: Secondary | ICD-10-CM | POA: Diagnosis not present

## 2023-12-13 DIAGNOSIS — Z7901 Long term (current) use of anticoagulants: Secondary | ICD-10-CM | POA: Diagnosis not present

## 2023-12-13 DIAGNOSIS — E876 Hypokalemia: Secondary | ICD-10-CM | POA: Diagnosis not present

## 2023-12-13 DIAGNOSIS — Z86711 Personal history of pulmonary embolism: Secondary | ICD-10-CM | POA: Diagnosis not present

## 2023-12-13 DIAGNOSIS — I2692 Saddle embolus of pulmonary artery without acute cor pulmonale: Principal | ICD-10-CM | POA: Diagnosis present

## 2023-12-13 DIAGNOSIS — Z807 Family history of other malignant neoplasms of lymphoid, hematopoietic and related tissues: Secondary | ICD-10-CM

## 2023-12-13 DIAGNOSIS — Z8249 Family history of ischemic heart disease and other diseases of the circulatory system: Secondary | ICD-10-CM

## 2023-12-13 DIAGNOSIS — R0602 Shortness of breath: Secondary | ICD-10-CM | POA: Diagnosis present

## 2023-12-13 DIAGNOSIS — M79661 Pain in right lower leg: Secondary | ICD-10-CM | POA: Diagnosis not present

## 2023-12-13 DIAGNOSIS — I2609 Other pulmonary embolism with acute cor pulmonale: Secondary | ICD-10-CM | POA: Diagnosis not present

## 2023-12-13 DIAGNOSIS — Z832 Family history of diseases of the blood and blood-forming organs and certain disorders involving the immune mechanism: Secondary | ICD-10-CM | POA: Diagnosis not present

## 2023-12-13 DIAGNOSIS — H9193 Unspecified hearing loss, bilateral: Secondary | ICD-10-CM | POA: Diagnosis present

## 2023-12-13 LAB — URINALYSIS, ROUTINE W REFLEX MICROSCOPIC
Glucose, UA: NEGATIVE mg/dL
Hgb urine dipstick: NEGATIVE
Ketones, ur: NEGATIVE mg/dL
Nitrite: NEGATIVE
Protein, ur: 100 mg/dL — AB
Specific Gravity, Urine: 1.03 (ref 1.005–1.030)
pH: 5 (ref 5.0–8.0)

## 2023-12-13 LAB — LIPASE, BLOOD: Lipase: 24 U/L (ref 11–51)

## 2023-12-13 LAB — COMPREHENSIVE METABOLIC PANEL
ALT: 19 U/L (ref 0–44)
AST: 30 U/L (ref 15–41)
Albumin: 4.1 g/dL (ref 3.5–5.0)
Alkaline Phosphatase: 169 U/L — ABNORMAL HIGH (ref 38–126)
Anion gap: 13 (ref 5–15)
BUN: 17 mg/dL (ref 8–23)
CO2: 23 mmol/L (ref 22–32)
Calcium: 8.9 mg/dL (ref 8.9–10.3)
Chloride: 101 mmol/L (ref 98–111)
Creatinine, Ser: 1.02 mg/dL — ABNORMAL HIGH (ref 0.44–1.00)
GFR, Estimated: 59 mL/min — ABNORMAL LOW (ref >60)
Glucose, Bld: 138 mg/dL — ABNORMAL HIGH (ref 70–99)
Potassium: 4 mmol/L (ref 3.5–5.1)
Sodium: 137 mmol/L (ref 135–145)
Total Bilirubin: 1.1 mg/dL (ref 0.0–1.2)
Total Protein: 9.3 g/dL — ABNORMAL HIGH (ref 6.5–8.1)

## 2023-12-13 MED ORDER — SENNOSIDES-DOCUSATE SODIUM 8.6-50 MG PO TABS
1.0000 | ORAL_TABLET | Freq: Every evening | ORAL | Status: DC | PRN
  Administered 2023-12-14 (×2): 1 via ORAL
  Filled 2023-12-13 (×2): qty 1

## 2023-12-13 MED ORDER — ACETAMINOPHEN 650 MG RE SUPP: 650.0000 mg | Freq: Four times a day (QID) | RECTAL | Status: DC | PRN

## 2023-12-13 MED ORDER — ACETAMINOPHEN 500 MG PO TABS
1000.0000 mg | ORAL_TABLET | Freq: Once | ORAL | Status: AC
  Administered 2023-12-13: 1000 mg via ORAL
  Filled 2023-12-13: qty 2

## 2023-12-13 MED ORDER — AMLODIPINE BESYLATE 5 MG PO TABS
10.0000 mg | ORAL_TABLET | Freq: Every day | ORAL | Status: DC
  Administered 2023-12-14: 10 mg via ORAL
  Filled 2023-12-13: qty 2

## 2023-12-13 MED ORDER — IOHEXOL 350 MG/ML SOLN
50.0000 mL | Freq: Once | INTRAVENOUS | Status: AC | PRN
  Administered 2023-12-13: 50 mL via INTRAVENOUS

## 2023-12-13 MED ORDER — HEPARIN (PORCINE) 25000 UT/250ML-% IV SOLN
1300.0000 [IU]/h | INTRAVENOUS | Status: DC
  Administered 2023-12-13: 1300 [IU]/h via INTRAVENOUS
  Filled 2023-12-13: qty 250

## 2023-12-13 MED ORDER — NITROFURANTOIN MONOHYD MACRO 100 MG PO CAPS
100.0000 mg | ORAL_CAPSULE | Freq: Once | ORAL | Status: AC
  Administered 2023-12-13: 100 mg via ORAL
  Filled 2023-12-13: qty 1

## 2023-12-13 MED ORDER — ONDANSETRON HCL 4 MG PO TABS: 4.0000 mg | ORAL_TABLET | Freq: Four times a day (QID) | ORAL | Status: DC | PRN

## 2023-12-13 MED ORDER — NITROFURANTOIN MONOHYD MACRO 100 MG PO CAPS: 100.0000 mg | ORAL_CAPSULE | Freq: Two times a day (BID) | ORAL | Status: DC

## 2023-12-13 MED ORDER — SODIUM CHLORIDE (PF) 0.9 % IJ SOLN
INTRAMUSCULAR | Status: AC
  Filled 2023-12-13: qty 50

## 2023-12-13 MED ORDER — HALOPERIDOL 2 MG PO TABS
2.0000 mg | ORAL_TABLET | Freq: Every day | ORAL | Status: DC
  Administered 2023-12-13 – 2023-12-14 (×2): 2 mg via ORAL
  Filled 2023-12-13 (×2): qty 1
  Filled 2023-12-13: qty 2

## 2023-12-13 MED ORDER — ONDANSETRON HCL 4 MG/2ML IJ SOLN: 4.0000 mg | Freq: Four times a day (QID) | INTRAMUSCULAR | Status: DC | PRN

## 2023-12-13 MED ORDER — ASPIRIN 81 MG PO TBEC
81.0000 mg | DELAYED_RELEASE_TABLET | Freq: Every day | ORAL | Status: DC
  Administered 2023-12-13: 81 mg via ORAL
  Filled 2023-12-13: qty 1

## 2023-12-13 MED ORDER — FENTANYL CITRATE PF 50 MCG/ML IJ SOSY
50.0000 ug | PREFILLED_SYRINGE | Freq: Once | INTRAMUSCULAR | Status: AC
  Administered 2023-12-13: 50 ug via INTRAVENOUS
  Filled 2023-12-13: qty 1

## 2023-12-13 MED ORDER — LOSARTAN POTASSIUM 50 MG PO TABS
50.0000 mg | ORAL_TABLET | Freq: Every day | ORAL | Status: DC
  Administered 2023-12-14: 50 mg via ORAL
  Filled 2023-12-13: qty 1

## 2023-12-13 MED ORDER — IOHEXOL 300 MG/ML  SOLN
100.0000 mL | Freq: Once | INTRAMUSCULAR | Status: AC | PRN
  Administered 2023-12-13: 75 mL via INTRAVENOUS

## 2023-12-13 MED ORDER — ACETAMINOPHEN 325 MG PO TABS
650.0000 mg | ORAL_TABLET | Freq: Four times a day (QID) | ORAL | Status: DC | PRN
  Administered 2023-12-13 – 2023-12-15 (×4): 650 mg via ORAL
  Filled 2023-12-13 (×4): qty 2

## 2023-12-13 MED ORDER — HEPARIN BOLUS VIA INFUSION
4600.0000 [IU] | Freq: Once | INTRAVENOUS | Status: AC
  Administered 2023-12-13: 4600 [IU] via INTRAVENOUS
  Filled 2023-12-13: qty 4600

## 2023-12-13 NOTE — ED Notes (Signed)
 [place] PIV placed.

## 2023-12-13 NOTE — H&P (Addendum)
 History and Physical  Angela Becker UJW:[id number] DOB: 25-Aug-1951 DOA: 12/13/2023  PCP: Patient, No Pcp Per   Chief Complaint: SOB, left side pain, dysuria  HPI: Angela Becker is a 73 y.o. female with medical history significant for deafness, HTN, PE previously treated with Eliquis, and delusional disorder presents to the ED for evaluation of shortness of breath, left flank pain and dysuria. Patient is deaf and only communicates with sign language so history obtained from her to daughters and son. Family reports that patient started having shortness of breath today with associated left flank pain that is worse with movement and deep inspiration. She also reported some burning with urination but has not had any chest pain, dizziness, headaches fevers or chills.  Patient has not had any recent long plane rides the family reports she is in bed most of the day. Patient was diagnosed with saddle PE in 2019 thought to be provoked with risk factors of recent travel and sedentary lifestyle. She was discharged home on Eliquis but has been off Eliquis for many years.  ED Course: Initial vitals showed temp 99, RR 17, HR 114, BP 139/92, SpO2 95% on room air.  Labs showed normal kidney function, alk phos 169 (hemolyzed sample), lipase 24, UA shows small bilirubinuria, moderate proteinuria, small leuks, WBC 6-10 and rare bacteria. CT A/P shows mild asymmetric thickening of the anterior urinary bladder concerning for chronic versus acute cystitis. CTA chest PE study shows moderate to large volume acute saddle pulmonary embolism with no right heart strain or lung infarction. Patient was started on IV heparin and given 1 dose of Macrobid 100 mg. [facility] was consulted for admission  Review of Systems: Please see HPI for pertinent positives and negatives. A complete 10 system review of systems are otherwise negative.  Past Medical History:  Diagnosis Date   Hypertension    History reviewed. No pertinent surgical  history. Social History:  reports that she has never smoked. She has never used smokeless tobacco. No history on file for alcohol use and drug use.  Allergies  Allergen Reactions   Penicillins     Family History  Problem Relation Age of Onset   CAD Mother    Hypertension Mother    Clotting disorder Sister    Multiple myeloma Sister    Diabetes Sister    CAD Sister    Hypertension Sister    Diabetes Other      Prior to Admission medications   Medication Sig Start Date End Date Taking? Authorizing Provider  losartan (COZAAR) 50 MG tablet Take 50 mg by mouth daily. 11/17/23  Yes [provider]  amLODipine (NORVASC) 10 MG tablet Take 10 mg by mouth daily.    [provider]  apixaban (ELIQUIS) 5 MG TABS tablet Take 2 tablets (10 mg total) by mouth 2 (two) times daily for 6 days. 06/23/18 12/13/23  Leroy Sea, MD  apixaban (ELIQUIS) 5 MG TABS tablet Take 1 tablet (5 mg total) by mouth 2 (two) times daily. 06/30/18   Leroy Sea, MD  brimonidine-timolol (COMBIGAN) 0.2-0.5 % ophthalmic solution Place 1 drop into the right eye 2 (two) times daily.    [provider]  Difluprednate (DUREZOL OP) Place 1 drop into the right eye 3 (three) times daily.    [provider]  haloperidol (HALDOL) 5 MG tablet Take 5 mg by mouth at bedtime.    [provider]    Physical Exam: BP 130/80 (BP Location: Left Arm)   Pulse  89   Temp 99 F (37.2 C) (Oral)   Resp 16   SpO2 92%  General: Pleasant, well-appearing, deaf elderly woman laying in bed. No acute distress. HEENT: [place]/AT. Anicteric sclera CV: Tachycardia. Regular rhythm. No murmurs, rubs, or gallops. No LE edema Pulmonary: Lungs CTAB. Normal effort. No wheezing or rales. Abdominal: Soft, nontender, nondistended. Normal bowel sounds. Extremities: Palpable radial and DP pulses. Normal ROM. Skin: Warm and dry. No obvious rash or lesions. Neuro: A&Ox3. Moves all extremities. Normal sensation  to light touch. No focal deficit. Psych: Normal mood and affect          Labs on Admission:  Basic Metabolic Panel: Recent Labs  Lab 12/13/23 1406  NA 137  K 4.0  CL 101  CO2 23  GLUCOSE 138*  BUN 17  CREATININE 1.02*  CALCIUM 8.9   Liver Function Tests: Recent Labs  Lab 12/13/23 1406  AST 30  ALT 19  ALKPHOS 169*  BILITOT 1.1  PROT 9.3*  ALBUMIN 4.1   Recent Labs  Lab 12/13/23 1406  LIPASE 24   No results for input(s): "AMMONIA" in the last 168 hours. CBC: No results for input(s): "WBC", "NEUTROABS", "HGB", "HCT", "MCV", "PLT" in the last 168 hours. Cardiac Enzymes: No results for input(s): "CKTOTAL", "CKMB", "CKMBINDEX", "TROPONINI" in the last 168 hours. BNP (last 3 results) No results for input(s): "BNP" in the last 8760 hours.  ProBNP (last 3 results) No results for input(s): "PROBNP" in the last 8760 hours.  CBG: No results for input(s): "GLUCAP" in the last 168 hours.  Radiological Exams on Admission: CT ABDOMEN PELVIS W CONTRAST Result Date: 12/13/2023 CLINICAL DATA:  Abdominal/flank pain, stone suspected; Pulmonary embolism (PE) suspected, high prob. Left-sided abdominal pain. EXAM: CT ANGIOGRAPHY CHEST CT ABDOMEN AND PELVIS WITH CONTRAST TECHNIQUE: Multidetector CT imaging of the chest was performed using the standard protocol during bolus administration of intravenous contrast. Multiplanar CT image reconstructions and MIPs were obtained to evaluate the vascular anatomy. Multidetector CT imaging of the abdomen and pelvis was performed using the standard protocol during bolus administration of intravenous contrast. RADIATION DOSE REDUCTION: This exam was performed according to the departmental dose-optimization program which includes automated exposure control, adjustment of the mA and/or kV according to patient size and/or use of iterative reconstruction technique. CONTRAST:  75mL OMNIPAQUE IOHEXOL 300 MG/ML SOLN; 50mL OMNIPAQUE IOHEXOL 350 MG/ML SOLN  COMPARISON:  CT angiography chest from 06/21/2018. FINDINGS: CTA CHEST FINDINGS Cardiovascular: There is moderate-to-large volume acute saddle pulmonary embolism involving the main pulmonary artery with extension into the bilateral lobar and segmental pulmonary arteries. There is also an occlusive thrombus in the left lung lower lobe lobar pulmonary artery. No right heart strain. No lung infarction. There is dilation of the main pulmonary trunk measuring up to 3.0 cm, which is nonspecific but can be seen with pulmonary artery hypertension. Normal cardiac size. No pericardial effusion. No aortic aneurysm. Mediastinum/Nodes: Visualized thyroid gland appears grossly unremarkable. No solid / cystic mediastinal masses. The esophagus is nondistended precluding optimal assessment. There are few mildly prominent mediastinal and hilar lymph nodes, which do not meet the size criteria for lymphadenopathy and though indeterminate most likely benign in etiology. No axillary lymphadenopathy by size criteria. Lungs/Pleura: The central tracheo-bronchial tree is patent. There are patchy areas of linear, plate-like atelectasis and/or scarring throughout bilateral lungs. There are also dependent changes in bilateral lungs. No mass or consolidation. No pleural effusion or pneumothorax. No suspicious lung nodules. Musculoskeletal: The visualized soft tissues of the chest  wall are grossly unremarkable. No suspicious osseous lesions. There are mild multilevel degenerative changes in the visualized spine. Review of the MIP images confirms the above findings. CT ABDOMEN and PELVIS FINDINGS Hepatobiliary: The liver is normal in size. Non-cirrhotic configuration. No suspicious mass. No intrahepatic or extrahepatic bile duct dilation. There are multiple air containing noncalcified gallstones without imaging signs of acute cholecystitis. Normal gallbladder wall thickness. No pericholecystic inflammatory changes. Pancreas: Unremarkable. No  pancreatic ductal dilatation or surrounding inflammatory changes. Spleen: Within normal limits. No focal lesion. Adrenals/Urinary Tract: Adrenal glands are unremarkable. No suspicious renal mass. No hydronephrosis. No renal or ureteric calculi. Urinary bladder is partially distended. There is mild asymmetric thickening of the anterior bladder wall without perivesical fat stranding, likely sequela of chronic versus acute cystitis. Correlate clinically and with urinalysis. Stomach/Bowel: No disproportionate dilation of the small or large bowel loops. No evidence of abnormal bowel wall thickening or inflammatory changes. The appendix is unremarkable. There are multiple diverticula throughout the colon, without imaging signs of diverticulitis. Vascular/Lymphatic: No ascites or pneumoperitoneum. No abdominal or pelvic lymphadenopathy, by size criteria. No aneurysmal dilation of the major abdominal arteries. There are mild peripheral atherosclerotic vascular calcifications of the aorta and its major branches. Reproductive: The uterus is unremarkable. No large adnexal mass. Other: The visualized soft tissues and abdominal wall are unremarkable. Musculoskeletal: No suspicious osseous lesions. There are mild multilevel degenerative changes in the visualized spine. Review of the MIP images confirms the above findings. IMPRESSION: 1. There is moderate-to-large volume acute saddle pulmonary embolism, as described above. No right heart strain. No lung infarction. 2. There is mild asymmetric thickening of the anterior urinary bladder wall without perivesical fat stranding, likely sequela of chronic versus acute cystitis. Correlate clinically and with urinalysis. 3. Otherwise no acute inflammatory process identified within the abdomen or pelvis. 4. Multiple other nonacute observations, as described above. Critical Value/emergent results were called by telephone at the time of interpretation on 12/13/2023 at 5:18 pm to provider Dr.  Gloris Manchester , who verbally acknowledged these results. Electronically Signed   By: Jules Schick M.D.   On: 12/13/2023 17:21   CT Angio Chest PE W and/or Wo Contrast Result Date: 12/13/2023 CLINICAL DATA:  Abdominal/flank pain, stone suspected; Pulmonary embolism (PE) suspected, high prob. Left-sided abdominal pain. EXAM: CT ANGIOGRAPHY CHEST CT ABDOMEN AND PELVIS WITH CONTRAST TECHNIQUE: Multidetector CT imaging of the chest was performed using the standard protocol during bolus administration of intravenous contrast. Multiplanar CT image reconstructions and MIPs were obtained to evaluate the vascular anatomy. Multidetector CT imaging of the abdomen and pelvis was performed using the standard protocol during bolus administration of intravenous contrast. RADIATION DOSE REDUCTION: This exam was performed according to the departmental dose-optimization program which includes automated exposure control, adjustment of the mA and/or kV according to patient size and/or use of iterative reconstruction technique. CONTRAST:  75mL OMNIPAQUE IOHEXOL 300 MG/ML SOLN; 50mL OMNIPAQUE IOHEXOL 350 MG/ML SOLN COMPARISON:  CT angiography chest from 06/21/2018. FINDINGS: CTA CHEST FINDINGS Cardiovascular: There is moderate-to-large volume acute saddle pulmonary embolism involving the main pulmonary artery with extension into the bilateral lobar and segmental pulmonary arteries. There is also an occlusive thrombus in the left lung lower lobe lobar pulmonary artery. No right heart strain. No lung infarction. There is dilation of the main pulmonary trunk measuring up to 3.0 cm, which is nonspecific but can be seen with pulmonary artery hypertension. Normal cardiac size. No pericardial effusion. No aortic aneurysm. Mediastinum/Nodes: Visualized thyroid gland appears grossly unremarkable.  No solid / cystic mediastinal masses. The esophagus is nondistended precluding optimal assessment. There are few mildly prominent mediastinal and hilar  lymph nodes, which do not meet the size criteria for lymphadenopathy and though indeterminate most likely benign in etiology. No axillary lymphadenopathy by size criteria. Lungs/Pleura: The central tracheo-bronchial tree is patent. There are patchy areas of linear, plate-like atelectasis and/or scarring throughout bilateral lungs. There are also dependent changes in bilateral lungs. No mass or consolidation. No pleural effusion or pneumothorax. No suspicious lung nodules. Musculoskeletal: The visualized soft tissues of the chest wall are grossly unremarkable. No suspicious osseous lesions. There are mild multilevel degenerative changes in the visualized spine. Review of the MIP images confirms the above findings. CT ABDOMEN and PELVIS FINDINGS Hepatobiliary: The liver is normal in size. Non-cirrhotic configuration. No suspicious mass. No intrahepatic or extrahepatic bile duct dilation. There are multiple air containing noncalcified gallstones without imaging signs of acute cholecystitis. Normal gallbladder wall thickness. No pericholecystic inflammatory changes. Pancreas: Unremarkable. No pancreatic ductal dilatation or surrounding inflammatory changes. Spleen: Within normal limits. No focal lesion. Adrenals/Urinary Tract: Adrenal glands are unremarkable. No suspicious renal mass. No hydronephrosis. No renal or ureteric calculi. Urinary bladder is partially distended. There is mild asymmetric thickening of the anterior bladder wall without perivesical fat stranding, likely sequela of chronic versus acute cystitis. Correlate clinically and with urinalysis. Stomach/Bowel: No disproportionate dilation of the small or large bowel loops. No evidence of abnormal bowel wall thickening or inflammatory changes. The appendix is unremarkable. There are multiple diverticula throughout the colon, without imaging signs of diverticulitis. Vascular/Lymphatic: No ascites or pneumoperitoneum. No abdominal or pelvic lymphadenopathy,  by size criteria. No aneurysmal dilation of the major abdominal arteries. There are mild peripheral atherosclerotic vascular calcifications of the aorta and its major branches. Reproductive: The uterus is unremarkable. No large adnexal mass. Other: The visualized soft tissues and abdominal wall are unremarkable. Musculoskeletal: No suspicious osseous lesions. There are mild multilevel degenerative changes in the visualized spine. Review of the MIP images confirms the above findings. IMPRESSION: 1. There is moderate-to-large volume acute saddle pulmonary embolism, as described above. No right heart strain. No lung infarction. 2. There is mild asymmetric thickening of the anterior urinary bladder wall without perivesical fat stranding, likely sequela of chronic versus acute cystitis. Correlate clinically and with urinalysis. 3. Otherwise no acute inflammatory process identified within the abdomen or pelvis. 4. Multiple other nonacute observations, as described above. Critical Value/emergent results were called by telephone at the time of interpretation on 12/13/2023 at 5:18 pm to provider Dr. Gloris Manchester , who verbally acknowledged these results. Electronically Signed   By: Jules Schick M.D.   On: 12/13/2023 17:21   DG Chest Portable 1 View Result Date: 12/13/2023 CLINICAL DATA:  Left lower chest pain EXAM: PORTABLE CHEST - 1 VIEW COMPARISON:  06/28/2018 FINDINGS: Low lung volumes. Coarse interstitial opacities in both lung bases increased from previous. No pneumothorax. Heart size and mediastinal contours are within normal limits. No effusion. Visualized bones unremarkable. IMPRESSION: Low volumes with bibasilar interstitial opacities. Electronically Signed   By: Corlis Leak M.D.   On: 12/13/2023 15:06   Assessment/Plan Angela Becker is a 73 y.o. female with medical history significant for deafness, HTN, PE previously treated with Eliquis, and delusional disorder presents to the ED for evaluation of shortness of  breath, left flank pain and dysuria admitted for acute saddle pulmonary embolism and UTI.  # Acute saddle pulmonary embolism Patient with history of pulmonary embolism 5 years ago  thought to be provoked by recent travel and sedentary lifestyle now presenting with 1 day of shortness of breath and left-sided pain found to have evidence of moderate to large volume acute subtle PE without right heart strain or lung infarction. Only risk factor at this time include sedentary lifestyle. She is tachycardic but hemodynamically stable with stable BP. She remains on room air with SpO2 in the low to mid 90s. -Admit to progressive bed -Continue IV heparin, transition to DOAC tomorrow -Check echocardiogram and U/S DVT study -Supplemental O2 as needed -Would likely need hypercoagulability workup in the outpatient  # UTI Presented with a few days of burning with urination and found to have evidence of infection on UA. CT A/P also showing mild symmetric thickening of the anterior urinary bladder. Patient has a penicillin allergy. -Continue Macrobid 100 mg every 12 hours -Follow-up urine culture -Trend CBC, fever curve  # HTN BP stable with SBP in the 130s to 140s. Will hold BP meds today and resume tomorrow -Continue amlodipine and losartan  # Delusional disorder -Continue Haldol  DVT prophylaxis: Heparin    Code Status: Full Code  Consults called: None  Family Communication: Discussed admission with 2 daughters and son at bedside  Severity of Illness: The appropriate patient status for this patient is INPATIENT. Inpatient status is judged to be reasonable and necessary in order to provide the required intensity of service to ensure the patient's safety. The patient's presenting symptoms, physical exam findings, and initial radiographic and laboratory data in the context of their chronic comorbidities is felt to place them at high risk for further clinical deterioration. Furthermore, it is not  anticipated that the patient will be medically stable for discharge from the hospital within 2 midnights of admission.   * I certify that at the point of admission it is my clinical judgment that the patient will require inpatient hospital care spanning beyond 2 midnights from the point of admission due to high intensity of service, high risk for further deterioration and high frequency of surveillance required.*  Level of care:  Progressive  Steffanie Rainwater, MD 12/13/2023, 5:42 PM [facility] Pager: [phone] Isaiah 41:10   If 7PM-7AM, please contact night-coverage [email or web address] Password [email or web address]

## 2023-12-13 NOTE — ED Triage Notes (Signed)
 Patient presents due to left side abdominal pain and shortness of breath. Patient also reports dysuria.

## 2023-12-13 NOTE — ED Provider Triage Note (Signed)
 Emergency Medicine Provider Triage Evaluation Note  Angela Becker , a 73 y.o. female  was evaluated in triage.  Pt complains of left-sided abdominal/flank pain.  Reports this started earlier today.  Does endorse some pain with urination as well as pain with deep inhalation.  Denies any recent hemoptysis, chest congestion, or diagnosis of any viral infection.  No recent sick contacts.  Patient is on Eliquis.  Review of Systems  Positive: As above Negative: As above  Physical Exam  BP (!) 139/92   Pulse (!) 114   Temp 99 F (37.2 C) (Oral)   Resp 17   SpO2 95%  Gen:   Awake, no distress   Resp:  Normal effort  MSK:   Moves extremities without difficulty  Other:  Tenderness to palpation.  Follow-up.  Left middle and left upper quadrants.  Left CVA tenderness.  Medical Decision Making  Medically screening exam initiated at 1:45 PM.  Appropriate orders placed.  Angela Becker was informed that the remainder of the evaluation will be completed by another provider, this initial triage assessment does not replace that evaluation, and the importance of remaining in the ED until their evaluation is complete.     Angela Knudsen, PA-C 12/13/23 1346

## 2023-12-13 NOTE — Progress Notes (Signed)
 PHARMACY - ANTICOAGULATION CONSULT NOTE  Pharmacy Consult for heparin Indication: pulmonary embolus  Allergies  Allergen Reactions   Penicillins     Patient Measurements:   Heparin Dosing Weight: 77 kg  Vital Signs: Temp: 99 F (37.2 C) (02/25 1308) Temp Source: Oral (02/25 1308) BP: 130/80 (02/25 1723) Pulse Rate: 89 (02/25 1723)  Labs: Recent Labs    12/13/23 1406  CREATININE 1.02*    CrCl cannot be calculated (Unknown ideal weight.).   Medical History: Past Medical History:  Diagnosis Date   Hypertension      Assessment: 73 year old female presented with abdominal pain and shortness of breath. Patient with history of saddle PE five years ago, was on Eliquis which she is no longer taking. CTA chest positive for moderate-to-large volume acute saddle PE, no right heart strain. Pharmacy consulted to manage heparin.  Baseline labs pending.  Goal of Therapy:  Heparin level 0.3-0.7 units/ml Monitor platelets by anticoagulation protocol: Yes   Plan:  -Heparin 4600 unit bolus -Heparin infusion at 1300 units/hr -Check heparin level 8 hours after start of infusion -Daily CBC  Pricilla Riffle, PharmD, BCPS Clinical Pharmacist 12/13/2023 5:42 PM

## 2023-12-13 NOTE — ED Provider Notes (Signed)
 [place] EMERGENCY DEPARTMENT AT [facility] Provider Note   CSN: [id number] Arrival date & time: 12/13/23  1303     History  Chief Complaint  Patient presents with   Abdominal Pain   Shortness of Breath   Dysuria    Angela Becker is a 73 y.o. female.   Abdominal Pain Associated symptoms: dysuria and shortness of breath   Shortness of Breath Associated symptoms: abdominal pain   Dysuria Associated symptoms: abdominal pain and flank pain   Patient presents for left flank pain.  Medical history includes PE, HTN, deafness, delusional disorder.  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  She was discharged on Eliquis at the time.  She longer takes the Eliquis.  Yesterday, she was in her normal state of health.  This morning, she had a sharp pain in her left side.  Pain is worsened with movements and deep inspiration.  She denies any other associated symptoms.  Patient took Tylenol at 1 PM.  This did help with her pain.     Home Medications Prior to Admission medications   Medication Sig Start Date End Date Taking? Authorizing Provider  amLODipine (NORVASC) 10 MG tablet Take 10 mg by mouth at bedtime.   Yes [provider]  aspirin EC 81 MG tablet Take 81 mg by mouth at bedtime. Swallow whole.   Yes [provider]  haloperidol (HALDOL) 2 MG tablet Take 2 mg by mouth at bedtime.   Yes [provider]  losartan (COZAAR) 50 MG tablet Take 50 mg by mouth at bedtime. 11/17/23  Yes [provider]      Allergies    Penicillins    Review of Systems   Review of Systems  Respiratory:  Positive for shortness of breath.   Gastrointestinal:  Positive for abdominal pain.  Genitourinary:  Positive for dysuria and flank pain.  All other systems reviewed and are negative.   Physical Exam Updated Vital Signs BP 138/80 (BP Location: Right Arm)   Pulse 95   Temp 98.1 F (36.7 C) (Oral)   Resp 18   SpO2 92%  Physical Exam Vitals  and nursing note reviewed.  Constitutional:      General: She is not in acute distress.    Appearance: She is well-developed. She is not ill-appearing, toxic-appearing or diaphoretic.  HENT:     Head: Normocephalic and atraumatic.  Eyes:     Extraocular Movements: Extraocular movements intact.     Conjunctiva/sclera: Conjunctivae normal.  Cardiovascular:     Rate and Rhythm: Normal rate and regular rhythm.  Pulmonary:     Effort: Pulmonary effort is normal. No respiratory distress.     Breath sounds: Normal breath sounds.  Abdominal:     Palpations: Abdomen is soft.     Tenderness: There is abdominal tenderness in the left upper quadrant. There is no guarding or rebound.  Musculoskeletal:        General: No swelling.     Cervical back: Neck supple.  Skin:    General: Skin is warm and dry.     Coloration: Skin is not cyanotic or jaundiced.  Neurological:     General: No focal deficit present.     Mental Status: She is alert and oriented to person, place, and time.  Psychiatric:        Mood and Affect: Mood normal.        Behavior: Behavior normal.     ED Results / Procedures / Treatments  Labs (all labs ordered are listed, but only abnormal results are displayed) Labs Reviewed  COMPREHENSIVE METABOLIC PANEL - Abnormal; Notable for the following components:      Result Value   Glucose, Bld 138 (*)    Creatinine, Ser 1.02 (*)    Total Protein 9.3 (*)    Alkaline Phosphatase 169 (*)    GFR, Estimated 59 (*)    All other components within normal limits  URINALYSIS, ROUTINE W REFLEX MICROSCOPIC - Abnormal; Notable for the following components:   Color, Urine AMBER (*)    APPearance CLOUDY (*)    Bilirubin Urine SMALL (*)    Protein, ur 100 (*)    Leukocytes,Ua SMALL (*)    Bacteria, UA RARE (*)    Non Squamous Epithelial 0-5 (*)    All other components within normal limits  URINE CULTURE  LIPASE, BLOOD  CBC WITH DIFFERENTIAL/PLATELET  HEPARIN LEVEL (UNFRACTIONATED)   CBC  HIV ANTIBODY (ROUTINE TESTING W REFLEX)  COMPREHENSIVE METABOLIC PANEL    EKG EKG Interpretation Date/Time:  Tuesday December 13 2023 14:11:21 EST Ventricular Rate:  106 PR Interval:  136 QRS Duration:  89 QT Interval:  311 QTC Calculation: 413 R Axis:   77  Text Interpretation: Sinus tachycardia Probable left atrial enlargement Confirmed by Gloris Manchester ([id number]) on 12/13/2023 4:10:47 PM  Radiology CT ABDOMEN PELVIS W CONTRAST Result Date: 12/13/2023 CLINICAL DATA:  Abdominal/flank pain, stone suspected; Pulmonary embolism (PE) suspected, high prob. Left-sided abdominal pain. EXAM: CT ANGIOGRAPHY CHEST CT ABDOMEN AND PELVIS WITH CONTRAST TECHNIQUE: Multidetector CT imaging of the chest was performed using the standard protocol during bolus administration of intravenous contrast. Multiplanar CT image reconstructions and MIPs were obtained to evaluate the vascular anatomy. Multidetector CT imaging of the abdomen and pelvis was performed using the standard protocol during bolus administration of intravenous contrast. RADIATION DOSE REDUCTION: This exam was performed according to the departmental dose-optimization program which includes automated exposure control, adjustment of the mA and/or kV according to patient size and/or use of iterative reconstruction technique. CONTRAST:  75mL OMNIPAQUE IOHEXOL 300 MG/ML SOLN; 50mL OMNIPAQUE IOHEXOL 350 MG/ML SOLN COMPARISON:  CT angiography chest from 06/21/2018. FINDINGS: CTA CHEST FINDINGS Cardiovascular: There is moderate-to-large volume acute saddle pulmonary embolism involving the main pulmonary artery with extension into the bilateral lobar and segmental pulmonary arteries. There is also an occlusive thrombus in the left lung lower lobe lobar pulmonary artery. No right heart strain. No lung infarction. There is dilation of the main pulmonary trunk measuring up to 3.0 cm, which is nonspecific but can be seen with pulmonary artery hypertension. Normal  cardiac size. No pericardial effusion. No aortic aneurysm. Mediastinum/Nodes: Visualized thyroid gland appears grossly unremarkable. No solid / cystic mediastinal masses. The esophagus is nondistended precluding optimal assessment. There are few mildly prominent mediastinal and hilar lymph nodes, which do not meet the size criteria for lymphadenopathy and though indeterminate most likely benign in etiology. No axillary lymphadenopathy by size criteria. Lungs/Pleura: The central tracheo-bronchial tree is patent. There are patchy areas of linear, plate-like atelectasis and/or scarring throughout bilateral lungs. There are also dependent changes in bilateral lungs. No mass or consolidation. No pleural effusion or pneumothorax. No suspicious lung nodules. Musculoskeletal: The visualized soft tissues of the chest wall are grossly unremarkable. No suspicious osseous lesions. There are mild multilevel degenerative changes in the visualized spine. Review of the MIP images confirms the above findings. CT ABDOMEN and PELVIS FINDINGS Hepatobiliary: The liver is normal in size. Non-cirrhotic configuration. No suspicious  mass. No intrahepatic or extrahepatic bile duct dilation. There are multiple air containing noncalcified gallstones without imaging signs of acute cholecystitis. Normal gallbladder wall thickness. No pericholecystic inflammatory changes. Pancreas: Unremarkable. No pancreatic ductal dilatation or surrounding inflammatory changes. Spleen: Within normal limits. No focal lesion. Adrenals/Urinary Tract: Adrenal glands are unremarkable. No suspicious renal mass. No hydronephrosis. No renal or ureteric calculi. Urinary bladder is partially distended. There is mild asymmetric thickening of the anterior bladder wall without perivesical fat stranding, likely sequela of chronic versus acute cystitis. Correlate clinically and with urinalysis. Stomach/Bowel: No disproportionate dilation of the small or large bowel loops. No  evidence of abnormal bowel wall thickening or inflammatory changes. The appendix is unremarkable. There are multiple diverticula throughout the colon, without imaging signs of diverticulitis. Vascular/Lymphatic: No ascites or pneumoperitoneum. No abdominal or pelvic lymphadenopathy, by size criteria. No aneurysmal dilation of the major abdominal arteries. There are mild peripheral atherosclerotic vascular calcifications of the aorta and its major branches. Reproductive: The uterus is unremarkable. No large adnexal mass. Other: The visualized soft tissues and abdominal wall are unremarkable. Musculoskeletal: No suspicious osseous lesions. There are mild multilevel degenerative changes in the visualized spine. Review of the MIP images confirms the above findings. IMPRESSION: 1. There is moderate-to-large volume acute saddle pulmonary embolism, as described above. No right heart strain. No lung infarction. 2. There is mild asymmetric thickening of the anterior urinary bladder wall without perivesical fat stranding, likely sequela of chronic versus acute cystitis. Correlate clinically and with urinalysis. 3. Otherwise no acute inflammatory process identified within the abdomen or pelvis. 4. Multiple other nonacute observations, as described above. Critical Value/emergent results were called by telephone at the time of interpretation on 12/13/2023 at 5:18 pm to provider Dr. Gloris Manchester , who verbally acknowledged these results. Electronically Signed   By: Jules Schick M.D.   On: 12/13/2023 17:21   CT Angio Chest PE W and/or Wo Contrast Result Date: 12/13/2023 CLINICAL DATA:  Abdominal/flank pain, stone suspected; Pulmonary embolism (PE) suspected, high prob. Left-sided abdominal pain. EXAM: CT ANGIOGRAPHY CHEST CT ABDOMEN AND PELVIS WITH CONTRAST TECHNIQUE: Multidetector CT imaging of the chest was performed using the standard protocol during bolus administration of intravenous contrast. Multiplanar CT image  reconstructions and MIPs were obtained to evaluate the vascular anatomy. Multidetector CT imaging of the abdomen and pelvis was performed using the standard protocol during bolus administration of intravenous contrast. RADIATION DOSE REDUCTION: This exam was performed according to the departmental dose-optimization program which includes automated exposure control, adjustment of the mA and/or kV according to patient size and/or use of iterative reconstruction technique. CONTRAST:  75mL OMNIPAQUE IOHEXOL 300 MG/ML SOLN; 50mL OMNIPAQUE IOHEXOL 350 MG/ML SOLN COMPARISON:  CT angiography chest from 06/21/2018. FINDINGS: CTA CHEST FINDINGS Cardiovascular: There is moderate-to-large volume acute saddle pulmonary embolism involving the main pulmonary artery with extension into the bilateral lobar and segmental pulmonary arteries. There is also an occlusive thrombus in the left lung lower lobe lobar pulmonary artery. No right heart strain. No lung infarction. There is dilation of the main pulmonary trunk measuring up to 3.0 cm, which is nonspecific but can be seen with pulmonary artery hypertension. Normal cardiac size. No pericardial effusion. No aortic aneurysm. Mediastinum/Nodes: Visualized thyroid gland appears grossly unremarkable. No solid / cystic mediastinal masses. The esophagus is nondistended precluding optimal assessment. There are few mildly prominent mediastinal and hilar lymph nodes, which do not meet the size criteria for lymphadenopathy and though indeterminate most likely benign in etiology. No axillary lymphadenopathy  by size criteria. Lungs/Pleura: The central tracheo-bronchial tree is patent. There are patchy areas of linear, plate-like atelectasis and/or scarring throughout bilateral lungs. There are also dependent changes in bilateral lungs. No mass or consolidation. No pleural effusion or pneumothorax. No suspicious lung nodules. Musculoskeletal: The visualized soft tissues of the chest wall are  grossly unremarkable. No suspicious osseous lesions. There are mild multilevel degenerative changes in the visualized spine. Review of the MIP images confirms the above findings. CT ABDOMEN and PELVIS FINDINGS Hepatobiliary: The liver is normal in size. Non-cirrhotic configuration. No suspicious mass. No intrahepatic or extrahepatic bile duct dilation. There are multiple air containing noncalcified gallstones without imaging signs of acute cholecystitis. Normal gallbladder wall thickness. No pericholecystic inflammatory changes. Pancreas: Unremarkable. No pancreatic ductal dilatation or surrounding inflammatory changes. Spleen: Within normal limits. No focal lesion. Adrenals/Urinary Tract: Adrenal glands are unremarkable. No suspicious renal mass. No hydronephrosis. No renal or ureteric calculi. Urinary bladder is partially distended. There is mild asymmetric thickening of the anterior bladder wall without perivesical fat stranding, likely sequela of chronic versus acute cystitis. Correlate clinically and with urinalysis. Stomach/Bowel: No disproportionate dilation of the small or large bowel loops. No evidence of abnormal bowel wall thickening or inflammatory changes. The appendix is unremarkable. There are multiple diverticula throughout the colon, without imaging signs of diverticulitis. Vascular/Lymphatic: No ascites or pneumoperitoneum. No abdominal or pelvic lymphadenopathy, by size criteria. No aneurysmal dilation of the major abdominal arteries. There are mild peripheral atherosclerotic vascular calcifications of the aorta and its major branches. Reproductive: The uterus is unremarkable. No large adnexal mass. Other: The visualized soft tissues and abdominal wall are unremarkable. Musculoskeletal: No suspicious osseous lesions. There are mild multilevel degenerative changes in the visualized spine. Review of the MIP images confirms the above findings. IMPRESSION: 1. There is moderate-to-large volume acute  saddle pulmonary embolism, as described above. No right heart strain. No lung infarction. 2. There is mild asymmetric thickening of the anterior urinary bladder wall without perivesical fat stranding, likely sequela of chronic versus acute cystitis. Correlate clinically and with urinalysis. 3. Otherwise no acute inflammatory process identified within the abdomen or pelvis. 4. Multiple other nonacute observations, as described above. Critical Value/emergent results were called by telephone at the time of interpretation on 12/13/2023 at 5:18 pm to provider Dr. Gloris Manchester , who verbally acknowledged these results. Electronically Signed   By: Jules Schick M.D.   On: 12/13/2023 17:21   DG Chest Portable 1 View Result Date: 12/13/2023 CLINICAL DATA:  Left lower chest pain EXAM: PORTABLE CHEST - 1 VIEW COMPARISON:  06/28/2018 FINDINGS: Low lung volumes. Coarse interstitial opacities in both lung bases increased from previous. No pneumothorax. Heart size and mediastinal contours are within normal limits. No effusion. Visualized bones unremarkable. IMPRESSION: Low volumes with bibasilar interstitial opacities. Electronically Signed   By: Corlis Leak M.D.   On: 12/13/2023 15:06    Procedures Procedures    Medications Ordered in ED Medications  heparin ADULT infusion 100 units/mL (25000 units/21mL) (1,300 Units/hr Intravenous Infusion Verify 12/13/23 2310)  acetaminophen (TYLENOL) tablet 650 mg (650 mg Oral Given 12/13/23 2227)    Or  acetaminophen (TYLENOL) suppository 650 mg ( Rectal See Alternative 12/13/23 2227)  senna-docusate (Senokot-S) tablet 1 tablet (has no administration in time range)  ondansetron (ZOFRAN) tablet 4 mg (has no administration in time range)    Or  ondansetron (ZOFRAN) injection 4 mg (has no administration in time range)  haloperidol (HALDOL) tablet 2 mg (2 mg Oral Given 12/13/23  2226)  aspirin EC tablet 81 mg (81 mg Oral Given 12/13/23 2227)  amLODipine (NORVASC) tablet 10 mg (has  no administration in time range)  losartan (COZAAR) tablet 50 mg (has no administration in time range)  nitrofurantoin (macrocrystal-monohydrate) (MACROBID) capsule 100 mg (has no administration in time range)  acetaminophen (TYLENOL) tablet 1,000 mg (1,000 mg Oral Given 12/13/23 1354)  iohexol (OMNIPAQUE) 300 MG/ML solution 100 mL (75 mLs Intravenous Contrast Given 12/13/23 1605)  iohexol (OMNIPAQUE) 350 MG/ML injection 50 mL (50 mLs Intravenous Contrast Given 12/13/23 1620)  fentaNYL (SUBLIMAZE) injection 50 mcg (50 mcg Intravenous Given 12/13/23 1658)  nitrofurantoin (macrocrystal-monohydrate) (MACROBID) capsule 100 mg (100 mg Oral Given 12/13/23 1851)  heparin bolus via infusion 4,600 Units (4,600 Units Intravenous Bolus from Bag 12/13/23 1828)    ED Course/ Medical Decision Making/ A&P                                 Medical Decision Making Amount and/or Complexity of Data Reviewed Labs: ordered. Radiology: ordered.  Risk Prescription drug management. Decision regarding hospitalization.   This patient presents to the ED for concern of left flank pain, this involves an extensive number of treatment options, and is a complaint that carries with it a high risk of complications and morbidity.  The differential diagnosis includes nephrolithiasis, constipation, colitis, PE, pneumonia, neoplasm, musculoskeletal etiology   Co morbidities that complicate the patient evaluation  Prior PE   Additional history obtained:  Additional history obtained from patient's family External records from outside source obtained and reviewed including EMR   Lab Tests:  I Ordered, and personally interpreted labs.  The pertinent results include: Normal kidney function, normal electrolytes, urinalysis with rare bacteria and pyuria.   Imaging Studies ordered:  I ordered imaging studies including chest x-ray, CTA chest, CT of abdomen and pelvis I independently visualized and interpreted imaging which  showed saddle PE identified on CTA chest.  CT of abdomen and pelvis showed mild asymmetric thickening of anterior urinary bladder wall with perivesical fat stranding. I agree with the radiologist interpretation   Cardiac Monitoring: / EKG:  The patient was maintained on a cardiac monitor.  I personally viewed and interpreted the cardiac monitored which showed an underlying rhythm of: Sinus rhythm  Problem List / ED Course / Critical interventions / Medication management  Patient presenting for acute onset of left flank pain starting earlier today.  When describing location, she points to lateral left upper quadrant/lower rib area.  Pain is pleuritic in nature.  She does have a history of PE.  On exam, she is overall well-appearing.  She is accompanied by her children who assist in sign language.  Patient does have tenderness to area pain.  Fentanyl was ordered for analgesia.  Workup was initiated.  CT of chest identified a saddle PE.  Heparin was ordered.  Patient and family were informed.  Although urinalysis was equivocal, there was some asymmetric bladder wall thickening with periventricular stranding on CT of abdomen and pelvis.  Will treat empirically for UTI while cultures are pending.  Patient was admitted for further management. I ordered medication including fentanyl for analgesia; heparin for PE; nitrofurantoin for UTI Reevaluation of the patient after these medicines showed that the patient improved I have reviewed the patients home medicines and have made adjustments as needed   Social Determinants of Health:  Deaf, requires sign language  CRITICAL CARE Performed by: Gloris Manchester  Total critical care time: 32 minutes  Critical care time was exclusive of separately billable procedures and treating other patients.  Critical care was necessary to treat or prevent imminent or life-threatening deterioration.  Critical care was time spent personally by me on the following  activities: development of treatment plan with patient and/or surrogate as well as nursing, discussions with consultants, evaluation of patient's response to treatment, examination of patient, obtaining history from patient or surrogate, ordering and performing treatments and interventions, ordering and review of laboratory studies, ordering and review of radiographic studies, pulse oximetry and re-evaluation of patient's condition.         Final Clinical Impression(s) / ED Diagnoses Final diagnoses:  Acute saddle pulmonary embolism without acute cor pulmonale (HCC)  Cystitis    Rx / DC Orders ED Discharge Orders     None         Gloris Manchester, MD 12/13/23 2315

## 2023-12-13 NOTE — ED Notes (Signed)
 Lavender clotted.  Please recollect.

## 2023-12-14 ENCOUNTER — Other Ambulatory Visit (HOSPITAL_COMMUNITY): Payer: Self-pay

## 2023-12-14 ENCOUNTER — Inpatient Hospital Stay (HOSPITAL_COMMUNITY): Payer: Medicare Other

## 2023-12-14 ENCOUNTER — Telehealth (HOSPITAL_COMMUNITY): Payer: Self-pay | Admitting: Pharmacy Technician

## 2023-12-14 DIAGNOSIS — I2609 Other pulmonary embolism with acute cor pulmonale: Secondary | ICD-10-CM

## 2023-12-14 DIAGNOSIS — I2692 Saddle embolus of pulmonary artery without acute cor pulmonale: Secondary | ICD-10-CM | POA: Diagnosis not present

## 2023-12-14 DIAGNOSIS — M79661 Pain in right lower leg: Secondary | ICD-10-CM

## 2023-12-14 DIAGNOSIS — N3090 Cystitis, unspecified without hematuria: Secondary | ICD-10-CM

## 2023-12-14 LAB — CBC
HCT: 38.4 % (ref 36.0–46.0)
Hemoglobin: 12.2 g/dL (ref 12.0–15.0)
MCH: 27.3 pg (ref 26.0–34.0)
MCHC: 31.8 g/dL (ref 30.0–36.0)
MCV: 85.9 fL (ref 80.0–100.0)
Platelets: 228 10*3/uL (ref 150–400)
RBC: 4.47 MIL/uL (ref 3.87–5.11)
RDW: 13.9 % (ref 11.5–15.5)
WBC: 8.1 10*3/uL (ref 4.0–10.5)
nRBC: 0 % (ref 0.0–0.2)

## 2023-12-14 LAB — COMPREHENSIVE METABOLIC PANEL
ALT: 13 U/L (ref 0–44)
AST: 17 U/L (ref 15–41)
Albumin: 3.4 g/dL — ABNORMAL LOW (ref 3.5–5.0)
Alkaline Phosphatase: 126 U/L (ref 38–126)
Anion gap: 10 (ref 5–15)
BUN: 16 mg/dL (ref 8–23)
CO2: 24 mmol/L (ref 22–32)
Calcium: 8.6 mg/dL — ABNORMAL LOW (ref 8.9–10.3)
Chloride: 103 mmol/L (ref 98–111)
Creatinine, Ser: 0.6 mg/dL (ref 0.44–1.00)
GFR, Estimated: >60 mL/min (ref >60)
Glucose, Bld: 114 mg/dL — ABNORMAL HIGH (ref 70–99)
Potassium: 3.3 mmol/L — ABNORMAL LOW (ref 3.5–5.1)
Sodium: 137 mmol/L (ref 135–145)
Total Bilirubin: 0.8 mg/dL (ref 0.0–1.2)
Total Protein: 7.6 g/dL (ref 6.5–8.1)

## 2023-12-14 LAB — ECHOCARDIOGRAM COMPLETE
Area-P 1/2: 4.24 cm2
Height: 66 in
S' Lateral: 1.9 cm
Weight: 2716.07 [oz_av]

## 2023-12-14 LAB — HIV ANTIBODY (ROUTINE TESTING W REFLEX): HIV Screen 4th Generation wRfx: NONREACTIVE

## 2023-12-14 LAB — HEPARIN LEVEL (UNFRACTIONATED): Heparin Unfractionated: 0.37 [IU]/mL (ref 0.30–0.70)

## 2023-12-14 MED ORDER — SODIUM CHLORIDE 0.9 % IV SOLN
1.0000 g | INTRAVENOUS | Status: DC
  Administered 2023-12-14 – 2023-12-15 (×2): 1 g via INTRAVENOUS
  Filled 2023-12-14 (×3): qty 10

## 2023-12-14 MED ORDER — PHENAZOPYRIDINE HCL 100 MG PO TABS
100.0000 mg | ORAL_TABLET | Freq: Three times a day (TID) | ORAL | Status: DC | PRN
  Administered 2023-12-14: 100 mg via ORAL
  Filled 2023-12-14 (×2): qty 1

## 2023-12-14 MED ORDER — APIXABAN 5 MG PO TABS: 5.0000 mg | ORAL_TABLET | Freq: Two times a day (BID) | ORAL | Status: DC

## 2023-12-14 MED ORDER — PHENAZOPYRIDINE HCL 100 MG PO TABS
100.0000 mg | ORAL_TABLET | Freq: Three times a day (TID) | ORAL | Status: DC
  Administered 2023-12-14 – 2023-12-15 (×6): 100 mg via ORAL
  Filled 2023-12-14 (×7): qty 1

## 2023-12-14 MED ORDER — APIXABAN 5 MG PO TABS
10.0000 mg | ORAL_TABLET | Freq: Two times a day (BID) | ORAL | Status: DC
  Administered 2023-12-14 – 2023-12-15 (×3): 10 mg via ORAL
  Filled 2023-12-14 (×3): qty 2

## 2023-12-14 MED ORDER — LIDOCAINE 5 % EX PTCH
1.0000 | MEDICATED_PATCH | CUTANEOUS | Status: DC
  Administered 2023-12-15: 1 via TRANSDERMAL
  Filled 2023-12-14 (×2): qty 1

## 2023-12-14 MED ORDER — POTASSIUM CHLORIDE CRYS ER 20 MEQ PO TBCR
40.0000 meq | EXTENDED_RELEASE_TABLET | Freq: Once | ORAL | Status: AC
  Administered 2023-12-14: 40 meq via ORAL
  Filled 2023-12-14: qty 2

## 2023-12-14 NOTE — Progress Notes (Signed)
 Pharmacy - IV heparin >> Eliquis  Assessment:    Please see note from Terrilee Files, PharmD earlier today for full details.  Briefly, 73 y.o. female on IV heparin for new acute saddle PE.  Hx same in 2019, treated w/ Eliquis at that time, but has since completed course Transition to Eliquis today per MD CBC, SCr WNL Most recent HL therapeutic Currently on aspirin 81 mg/d at home  Plan:  Start Eliquis 10 mg PO bid x 7 days, followed by 5 mg PO bid thereafter Stop heparin with first dose of Eliquis After d/w MD, will stop ASA, as no 2ndary ppx indication (Hx CVA, CAD, PVD, etc)  Bernadene Person, PharmD, BCPS [phone] 12/14/2023, 11:14 AM

## 2023-12-14 NOTE — Progress Notes (Signed)
 SATURATION QUALIFICATIONS: (This note is used to comply with regulatory documentation for home oxygen)  Patient Saturations on Room Air at Rest = 93%  Patient Saturations on Room Air while Ambulating = 83%  Patient Saturations on 4 Liters of oxygen while Ambulating = 96%  Please briefly explain why patient needs home oxygen: The patient ambulated in the hallway with PT. Her oxygen decreased to 83%. She ambulated back to her room and oxygen initiated at 2 lt,sat increased to 88%, then ultimately 4 lt with O2 sat 96%. After resting for five minutes she was weaned down to 3 Lt [place].

## 2023-12-14 NOTE — Progress Notes (Signed)
 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  On presentation, she was hemodynamically stable, on room air.  UA was suspicious for UTI.  CT abdomen/pelvis showed asymmetrical thickening of anterior urinary bladder concerning for chronic versus acute cystitis.  CTA chest PE study showed moderate to large volume acute saddle pulmonary emboli with no right heart strain.  Started on IV heparin, antibiotic for UTI.   Clinically stable.  Assessment & Plan:  Principal Problem:   Acute saddle pulmonary embolism (HCC) Active Problems:   Cystitis  Acute saddle pulmonary embolism: History of PE about 5 years ago, previously was taking Eliquis.  Presented with shortness of breath, left-sided chest pain.  Found to have large volume acute saddle PE without right heart strain or lung function.    Currently she is hemodynamically stable, on room air.  Echo showed EF of 65 to 70%, grade 1 diastolic dysfunction, normal right ventricle function.  Anticoagulation changed to Eliquis.  Doppler study did not show any DVT.  UTI: Presented with dysuria, left flank pain.  UA suspicious for UTI.  Currently on ceftriaxone.  Follow-up urine culture  Hypertension: Currently blood pressure stable.  Home medications on hold: Takes amlodipine, losartan  Hypokalemia: Supplement with potassium  History of delusional disorder/deafness: On Haldol  Debility: Lives with daughter.  PT consulted         DVT prophylaxis: Eliquis     Code Status: Full Code  Family Communication: Called daughter Bethanie Dicker on phone for update on 2/26, call not received  Patient status: Inpatient  Patient is from : Home  Anticipated discharge to: Home  Estimated DC date:  tomorrow   Consultants: None  Procedures: None  Antimicrobials:  Anti-infectives (From admission, onward)    Start     Dose/Rate Route Frequency Ordered Stop   12/14/23 1000  nitrofurantoin (macrocrystal-monohydrate) (MACROBID) capsule 100 mg  Status:  Discontinued        100 mg Oral Every 12 hours 12/13/23 2311 12/14/23 0748   12/14/23 0845  cefTRIAXone (ROCEPHIN) 1 g in sodium chloride 0.9 % 100 mL IVPB        1 g 200 mL/hr over 30 Minutes Intravenous Every 24 hours 12/14/23 0748     12/13/23 1730  nitrofurantoin (macrocrystal-monohydrate) (MACROBID) capsule 100 mg        100 mg Oral  Once 12/13/23 1729 12/13/23 1851       Subjective: Patient seen and examined at bedside today.  Hemodynamically stable.  Comfortable.  Lying in bed.  Sign language interpreter helped with the communication.  Denies any chest pain or shortness of breath.  On room air.  Objective: Vitals:   12/13/23 2240 12/13/23 2357 12/14/23 0100 12/14/23 0405  BP: 138/80 136/76  128/75  Pulse: 95 85  79  Resp: 18 18  19   Temp: 98.1 F (36.7 C) 98.4 F (36.9 C)  98.5 F (36.9 C)  TempSrc: Oral Oral  Oral  SpO2: 92% 92%  95%  Weight:   77 kg   Height:   5\' 6"  (1.676 m)     Intake/Output Summary (Last 24 hours) at 12/14/2023 0748 Last data filed at 12/13/2023 2330 Gross per 24 hour  Intake 59.99 ml  Output 1 ml  Net 58.99 ml  Filed Weights   12/14/23 0100  Weight: 77 kg    Examination:  General exam: Overall comfortable, not in distress HEENT: PERRL, deaf Respiratory system:  no wheezes or crackles  Cardiovascular system: S1 & S2 heard, RRR.  Gastrointestinal system: Abdomen is nondistended, soft and nontender. Central nervous system: Alert and oriented Extremities: No edema, no clubbing ,no cyanosis Skin: No rashes, no ulcers,no icterus     Data Reviewed: I have personally reviewed following labs and imaging studies  CBC: Recent Labs  Lab 12/14/23 0449  WBC 8.1  HGB 12.2  HCT 38.4   MCV 85.9  PLT 228   Basic Metabolic Panel: Recent Labs  Lab 12/13/23 1406 12/14/23 0449  NA 137 137  K 4.0 3.3*  CL 101 103  CO2 23 24  GLUCOSE 138* 114*  BUN 17 16  CREATININE 1.02* 0.60  CALCIUM 8.9 8.6*     No results found for this or any previous visit (from the past 240 hours).   Radiology Studies: CT ABDOMEN PELVIS W CONTRAST Result Date: 12/13/2023 CLINICAL DATA:  Abdominal/flank pain, stone suspected; Pulmonary embolism (PE) suspected, high prob. Left-sided abdominal pain. EXAM: CT ANGIOGRAPHY CHEST CT ABDOMEN AND PELVIS WITH CONTRAST TECHNIQUE: Multidetector CT imaging of the chest was performed using the standard protocol during bolus administration of intravenous contrast. Multiplanar CT image reconstructions and MIPs were obtained to evaluate the vascular anatomy. Multidetector CT imaging of the abdomen and pelvis was performed using the standard protocol during bolus administration of intravenous contrast. RADIATION DOSE REDUCTION: This exam was performed according to the departmental dose-optimization program which includes automated exposure control, adjustment of the mA and/or kV according to patient size and/or use of iterative reconstruction technique. CONTRAST:  75mL OMNIPAQUE IOHEXOL 300 MG/ML SOLN; 50mL OMNIPAQUE IOHEXOL 350 MG/ML SOLN COMPARISON:  CT angiography chest from 06/21/2018. FINDINGS: CTA CHEST FINDINGS Cardiovascular: There is moderate-to-large volume acute saddle pulmonary embolism involving the main pulmonary artery with extension into the bilateral lobar and segmental pulmonary arteries. There is also an occlusive thrombus in the left lung lower lobe lobar pulmonary artery. No right heart strain. No lung infarction. There is dilation of the main pulmonary trunk measuring up to 3.0 cm, which is nonspecific but can be seen with pulmonary artery hypertension. Normal cardiac size. No pericardial effusion. No aortic aneurysm. Mediastinum/Nodes: Visualized  thyroid gland appears grossly unremarkable. No solid / cystic mediastinal masses. The esophagus is nondistended precluding optimal assessment. There are few mildly prominent mediastinal and hilar lymph nodes, which do not meet the size criteria for lymphadenopathy and though indeterminate most likely benign in etiology. No axillary lymphadenopathy by size criteria. Lungs/Pleura: The central tracheo-bronchial tree is patent. There are patchy areas of linear, plate-like atelectasis and/or scarring throughout bilateral lungs. There are also dependent changes in bilateral lungs. No mass or consolidation. No pleural effusion or pneumothorax. No suspicious lung nodules. Musculoskeletal: The visualized soft tissues of the chest wall are grossly unremarkable. No suspicious osseous lesions. There are mild multilevel degenerative changes in the visualized spine. Review of the MIP images confirms the above findings. CT ABDOMEN and PELVIS FINDINGS Hepatobiliary: The liver is normal in size. Non-cirrhotic configuration. No suspicious mass. No intrahepatic or extrahepatic bile duct dilation. There are multiple air containing noncalcified gallstones without imaging signs of acute cholecystitis. Normal gallbladder wall thickness. No pericholecystic inflammatory changes. Pancreas: Unremarkable. No pancreatic ductal dilatation or surrounding inflammatory changes. Spleen: Within normal limits. No focal lesion. Adrenals/Urinary Tract: Adrenal glands are unremarkable. No suspicious renal mass.  No hydronephrosis. No renal or ureteric calculi. Urinary bladder is partially distended. There is mild asymmetric thickening of the anterior bladder wall without perivesical fat stranding, likely sequela of chronic versus acute cystitis. Correlate clinically and with urinalysis. Stomach/Bowel: No disproportionate dilation of the small or large bowel loops. No evidence of abnormal bowel wall thickening or inflammatory changes. The appendix is  unremarkable. There are multiple diverticula throughout the colon, without imaging signs of diverticulitis. Vascular/Lymphatic: No ascites or pneumoperitoneum. No abdominal or pelvic lymphadenopathy, by size criteria. No aneurysmal dilation of the major abdominal arteries. There are mild peripheral atherosclerotic vascular calcifications of the aorta and its major branches. Reproductive: The uterus is unremarkable. No large adnexal mass. Other: The visualized soft tissues and abdominal wall are unremarkable. Musculoskeletal: No suspicious osseous lesions. There are mild multilevel degenerative changes in the visualized spine. Review of the MIP images confirms the above findings. IMPRESSION: 1. There is moderate-to-large volume acute saddle pulmonary embolism, as described above. No right heart strain. No lung infarction. 2. There is mild asymmetric thickening of the anterior urinary bladder wall without perivesical fat stranding, likely sequela of chronic versus acute cystitis. Correlate clinically and with urinalysis. 3. Otherwise no acute inflammatory process identified within the abdomen or pelvis. 4. Multiple other nonacute observations, as described above. Critical Value/emergent results were called by telephone at the time of interpretation on 12/13/2023 at 5:18 pm to provider Dr. Gloris Manchester , who verbally acknowledged these results. Electronically Signed   By: Jules Schick M.D.   On: 12/13/2023 17:21   CT Angio Chest PE W and/or Wo Contrast Result Date: 12/13/2023 CLINICAL DATA:  Abdominal/flank pain, stone suspected; Pulmonary embolism (PE) suspected, high prob. Left-sided abdominal pain. EXAM: CT ANGIOGRAPHY CHEST CT ABDOMEN AND PELVIS WITH CONTRAST TECHNIQUE: Multidetector CT imaging of the chest was performed using the standard protocol during bolus administration of intravenous contrast. Multiplanar CT image reconstructions and MIPs were obtained to evaluate the vascular anatomy. Multidetector CT  imaging of the abdomen and pelvis was performed using the standard protocol during bolus administration of intravenous contrast. RADIATION DOSE REDUCTION: This exam was performed according to the departmental dose-optimization program which includes automated exposure control, adjustment of the mA and/or kV according to patient size and/or use of iterative reconstruction technique. CONTRAST:  75mL OMNIPAQUE IOHEXOL 300 MG/ML SOLN; 50mL OMNIPAQUE IOHEXOL 350 MG/ML SOLN COMPARISON:  CT angiography chest from 06/21/2018. FINDINGS: CTA CHEST FINDINGS Cardiovascular: There is moderate-to-large volume acute saddle pulmonary embolism involving the main pulmonary artery with extension into the bilateral lobar and segmental pulmonary arteries. There is also an occlusive thrombus in the left lung lower lobe lobar pulmonary artery. No right heart strain. No lung infarction. There is dilation of the main pulmonary trunk measuring up to 3.0 cm, which is nonspecific but can be seen with pulmonary artery hypertension. Normal cardiac size. No pericardial effusion. No aortic aneurysm. Mediastinum/Nodes: Visualized thyroid gland appears grossly unremarkable. No solid / cystic mediastinal masses. The esophagus is nondistended precluding optimal assessment. There are few mildly prominent mediastinal and hilar lymph nodes, which do not meet the size criteria for lymphadenopathy and though indeterminate most likely benign in etiology. No axillary lymphadenopathy by size criteria. Lungs/Pleura: The central tracheo-bronchial tree is patent. There are patchy areas of linear, plate-like atelectasis and/or scarring throughout bilateral lungs. There are also dependent changes in bilateral lungs. No mass or consolidation. No pleural effusion or pneumothorax. No suspicious lung nodules. Musculoskeletal: The visualized soft tissues of the chest wall are grossly unremarkable.  No suspicious osseous lesions. There are mild multilevel degenerative  changes in the visualized spine. Review of the MIP images confirms the above findings. CT ABDOMEN and PELVIS FINDINGS Hepatobiliary: The liver is normal in size. Non-cirrhotic configuration. No suspicious mass. No intrahepatic or extrahepatic bile duct dilation. There are multiple air containing noncalcified gallstones without imaging signs of acute cholecystitis. Normal gallbladder wall thickness. No pericholecystic inflammatory changes. Pancreas: Unremarkable. No pancreatic ductal dilatation or surrounding inflammatory changes. Spleen: Within normal limits. No focal lesion. Adrenals/Urinary Tract: Adrenal glands are unremarkable. No suspicious renal mass. No hydronephrosis. No renal or ureteric calculi. Urinary bladder is partially distended. There is mild asymmetric thickening of the anterior bladder wall without perivesical fat stranding, likely sequela of chronic versus acute cystitis. Correlate clinically and with urinalysis. Stomach/Bowel: No disproportionate dilation of the small or large bowel loops. No evidence of abnormal bowel wall thickening or inflammatory changes. The appendix is unremarkable. There are multiple diverticula throughout the colon, without imaging signs of diverticulitis. Vascular/Lymphatic: No ascites or pneumoperitoneum. No abdominal or pelvic lymphadenopathy, by size criteria. No aneurysmal dilation of the major abdominal arteries. There are mild peripheral atherosclerotic vascular calcifications of the aorta and its major branches. Reproductive: The uterus is unremarkable. No large adnexal mass. Other: The visualized soft tissues and abdominal wall are unremarkable. Musculoskeletal: No suspicious osseous lesions. There are mild multilevel degenerative changes in the visualized spine. Review of the MIP images confirms the above findings. IMPRESSION: 1. There is moderate-to-large volume acute saddle pulmonary embolism, as described above. No right heart strain. No lung infarction. 2.  There is mild asymmetric thickening of the anterior urinary bladder wall without perivesical fat stranding, likely sequela of chronic versus acute cystitis. Correlate clinically and with urinalysis. 3. Otherwise no acute inflammatory process identified within the abdomen or pelvis. 4. Multiple other nonacute observations, as described above. Critical Value/emergent results were called by telephone at the time of interpretation on 12/13/2023 at 5:18 pm to provider Dr. Gloris Manchester , who verbally acknowledged these results. Electronically Signed   By: Jules Schick M.D.   On: 12/13/2023 17:21   DG Chest Portable 1 View Result Date: 12/13/2023 CLINICAL DATA:  Left lower chest pain EXAM: PORTABLE CHEST - 1 VIEW COMPARISON:  06/28/2018 FINDINGS: Low lung volumes. Coarse interstitial opacities in both lung bases increased from previous. No pneumothorax. Heart size and mediastinal contours are within normal limits. No effusion. Visualized bones unremarkable. IMPRESSION: Low volumes with bibasilar interstitial opacities. Electronically Signed   By: Corlis Leak M.D.   On: 12/13/2023 15:06    Scheduled Meds:  amLODipine  10 mg Oral QHS   aspirin EC  81 mg Oral QHS   haloperidol  2 mg Oral QHS   lidocaine  1 patch Transdermal Q24H   losartan  50 mg Oral QHS   phenazopyridine  100 mg Oral TID WC   potassium chloride  40 mEq Oral Once   Continuous Infusions:  cefTRIAXone (ROCEPHIN)  IV     heparin 1,300 Units/hr (12/13/23 2310)     LOS: 1 day   Burnadette Pop, MD [facility] P2/26/2025, 7:48 AM

## 2023-12-14 NOTE — Telephone Encounter (Signed)
 Patient Product/process development scientist completed.    The patient is insured through [employer]. Patient has Medicare and is not eligible for a copay card, but may be able to apply for patient assistance or Medicare RX Payment Plan (Patient Must reach out to their plan, if eligible for payment plan), if available.    Ran test claim for Eliquis 5 mg and the current 30 day co-pay is $4.80.  Ran test claim for Xarelto 20 mg and the current 30 day co-pay is $4.80.  This test claim was processed through [facility]- copay amounts may vary at other pharmacies due to pharmacy/plan contracts, or as the patient moves through the different stages of their insurance plan.     Roland Earl, CPHT Pharmacy Technician III Certified Patient Advocate [facility] Pharmacy Patient Advocate Team Direct Number: [phone]  Fax: [phone]

## 2023-12-14 NOTE — Progress Notes (Signed)
 PHARMACY - ANTICOAGULATION CONSULT NOTE  Pharmacy Consult for heparin Indication: pulmonary embolus  Allergies  Allergen Reactions   Penicillins     Patient Measurements: Height: 5\' 6"  (167.6 cm) Weight: 77 kg (169 lb 12.1 oz) IBW/kg (Calculated) : 59.3 Heparin Dosing Weight: 77 kg  Vital Signs: Temp: 98.5 F (36.9 C) (02/26 0405) Temp Source: Oral (02/26 0405) BP: 128/75 (02/26 0405) Pulse Rate: 79 (02/26 0405)  Labs: Recent Labs    12/13/23 1406 12/14/23 0449  HGB  --  12.2  HCT  --  38.4  PLT  --  228  HEPARINUNFRC  --  0.37  CREATININE 1.02* 0.60    Estimated Creatinine Clearance: 68.6 mL/min (by C-G formula based on SCr of 0.6 mg/dL).   Medical History: Past Medical History:  Diagnosis Date   Hypertension      Assessment: 73 year old female presented with abdominal pain and shortness of breath. Patient with history of saddle PE five years ago, was on Eliquis which she is no longer taking. CTA chest positive for moderate-to-large volume acute saddle PE, no right heart strain. Pharmacy consulted to manage heparin.  12/14/23 Heparin level = 0.37 (therapeutic) with heparin gtt @ 1300 units/hr CBC wnl No complications of therapy noted  Goal of Therapy:  Heparin level 0.3-0.7 units/ml Monitor platelets by anticoagulation protocol: Yes   Plan:  -Continue Heparin infusion at 1300 units/hr -Recheck heparin level in 8 hours to confirm therapeutic dose -Daily CBC - F/U plans to transition to oral DOAC  Rahiem Schellinger, Joselyn Glassman, PharmD Clinical Pharmacist 12/14/2023 5:44 AM

## 2023-12-14 NOTE — Evaluation (Addendum)
 Physical Therapy Evaluation Patient Details Name: Angela Becker MRN: [id number] DOB: 10-Sep-1951 Today's Date: 12/14/2023  History of Present Illness  73 year old deaf female who presented from home with complaint of shortness of breath, left flank pain, dysuria. Dx of saddle pulmonary embolism, UTI. Pt with history of hypertension, PE, previously taking Eliquis.  Clinical Impression  Pt admitted with above diagnosis. Pt ambulated 140' with RW, no loss of balance, 3/4 dyspnea, SpO2 83% on room air walking, 91% on 2L O2 after several minutes seated rest. Utilized ASL interpreter via ipad. Per daughter, pt is independent with mobility without an assistive device at baseline and hasn't had falls in past 6 months.  Pt currently with functional limitations due to the deficits listed below (see PT Problem List). Pt will benefit from acute skilled PT to increase their independence and safety with mobility to allow discharge.           If plan is discharge home, recommend the following: A little help with bathing/dressing/bathroom;A little help with walking and/or transfers;Assistance with cooking/housework;Assist for transportation   Can travel by private Data processing manager (4 wheels)  Recommendations for Other Services       Functional Status Assessment Patient has had a recent decline in their functional status and demonstrates the ability to make significant improvements in function in a reasonable and predictable amount of time.     Precautions / Restrictions Precautions Precautions: Other (comment) Recall of Precautions/Restrictions: Impaired Precaution/Restrictions Comments: monitor O2 Restrictions Weight Bearing Restrictions Per Provider Order: No      Mobility  Bed Mobility Overal bed mobility: Modified Independent             General bed mobility comments: HOB up, used rail    Transfers Overall transfer level: Needs assistance Equipment  used: None Transfers: Sit to/from Stand Sit to Stand: Contact guard assist                Ambulation/Gait Ambulation/Gait assistance: Supervision Gait Distance (Feet): 140 Feet Assistive device: Rolling walker (2 wheels) Gait Pattern/deviations: Step-through pattern, Decreased stride length Gait velocity: decr     General Gait Details: steady, no loss of balance, SpO2 83% on room air walking, 91% on 2L O2 after several minutes seated rest, pt c/o L lateral trunk pain, RN notified of hypoxia with ambulation and request for pain meds. Cues for pursed lip breathing.  Stairs            Wheelchair Mobility     Tilt Bed    Modified Rankin (Stroke Patients Only)       Balance Overall balance assessment: Modified Independent                                           Pertinent Vitals/Pain Pain Assessment Pain Assessment: Faces Faces Pain Scale: Hurts even more Pain Location: L side of trunk Pain Descriptors / Indicators: Grimacing, Guarding Pain Intervention(s): Limited activity within patient's tolerance, Monitored during session, Patient requesting pain meds-RN notified, Repositioned    Home Living Family/patient expects to be discharged to:: Private residence Living Arrangements: Children Available Help at Discharge: Family   Home Access: Level entry       Home Layout: One level Home Equipment: Pharmacist, hospital (2 wheels);Grab bars - toilet Additional Comments: lives with daughter    Prior Function Prior Level of Function :  Independent/Modified Independent             Mobility Comments: walks without AD, no falls in past 6 months per daughter ADLs Comments: set up assist for bathing     Extremity/Trunk Assessment   Upper Extremity Assessment Upper Extremity Assessment: Overall WFL for tasks assessed    Lower Extremity Assessment Lower Extremity Assessment: Overall WFL for tasks assessed    Cervical / Trunk  Assessment Cervical / Trunk Assessment: Normal  Communication   Communication Communication: Impaired Factors Affecting Communication: Hearing impaired (pt is deaf,utilized ASL interpreter)    Cognition Arousal: Alert Behavior During Therapy: WFL for tasks assessed/performed   PT - Cognitive impairments: History of cognitive impairments                       PT - Cognition Comments: at times pt didn't respond to questions asked via ASL interpreter, daughter stated this is baseline Following commands: Impaired Following commands impaired: Follows one step commands inconsistently     Cueing Cueing Techniques: Verbal cues, Gestural cues, Tactile cues     General Comments      Exercises     Assessment/Plan    PT Assessment Patient needs continued PT services  PT Problem List Cardiopulmonary status limiting activity;Decreased activity tolerance       PT Treatment Interventions Gait training;DME instruction;Therapeutic activities;Therapeutic exercise;Functional mobility training;Patient/family education    PT Goals (Current goals can be found in the Care Plan section)  Acute Rehab PT Goals Patient Stated Goal: improve breathing/decrease pain PT Goal Formulation: With patient/family Time For Goal Achievement: 12/28/23 Potential to Achieve Goals: Good    Frequency Min 1X/week     Co-evaluation               AM-PAC PT "6 Clicks" Mobility  Outcome Measure Help needed turning from your back to your side while in a flat bed without using bedrails?: None Help needed moving from lying on your back to sitting on the side of a flat bed without using bedrails?: None Help needed moving to and from a bed to a chair (including a wheelchair)?: A Little Help needed standing up from a chair using your arms (e.g., wheelchair or bedside chair)?: None Help needed to walk in hospital room?: None Help needed climbing 3-5 steps with a railing? : A Little 6 Click Score:  22    End of Session Equipment Utilized During Treatment: Gait belt;Oxygen Activity Tolerance: Patient limited by fatigue;Patient limited by pain Patient left: in chair;with call bell/phone within reach;with family/visitor present;with chair alarm set Nurse Communication: Mobility status PT Visit Diagnosis: Difficulty in walking, not elsewhere classified (R26.2)    Time: 3086-5784 PT Time Calculation (min) (ACUTE ONLY): 27 min   Charges:   PT Evaluation $PT Eval Moderate Complexity: 1 Mod PT Treatments $Gait Training: 8-22 mins PT General Charges $$ ACUTE PT VISIT: 1 Visit         Tamala Ser PT 12/14/2023  Acute Rehabilitation Services  Office [phone]

## 2023-12-14 NOTE — Progress Notes (Signed)
 Interpreter line contacted [phone] regarding services for tomorrow 12/15/2023 at 9 am per hospitalist request. Voicemail left for return call. Emergency number [phone]

## 2023-12-14 NOTE — TOC Initial Note (Addendum)
 Transition of Care [facility]) - Initial/Assessment Note    Patient Details  Name: Angela Becker MRN: [id number] Date of Birth: 1951-08-07  [facility]) CM/SW Contact:    Lanier Clam, RN Phone Number: 12/14/2023, 1:50 PM  Clinical Narrative: Patient uses sign language-awaiting sign interpreter for discussion of d/c plans.  -4p-used sign language interpreter-patient/family d/c plan home w/HHC-no preference-[employer] rep Cindie aware-[employer] rep Jermaine aware to deliver rollator to rm prior d/c-may need 02 travel tank to rm prior d/c-noted qualifying sats-await 02 order. Has own transport home.                 Expected Discharge Plan: Home/Self Care Barriers to Discharge: Continued Medical Work up   Patient Goals and CMS Choice Patient states their goals for this hospitalization and ongoing recovery are:: Home CMS Medicare.gov Compare Post Acute Care list provided to:: Patient Choice offered to / list presented to : Patient [place] ownership interest in [facility].provided to:: Patient    Expected Discharge Plan and Services   Discharge Planning Services: CM Consult Post Acute Care Choice: Resumption of Svcs/PTA Provider Living arrangements for the past 2 months: Single Family Home                                      Prior Living Arrangements/Services Living arrangements for the past 2 months: Single Family Home Lives with:: Adult Children   Do you feel safe going back to the place where you live?: Yes               Activities of Daily Living   ADL Screening (condition at time of admission) Independently performs ADLs?: Yes (appropriate for developmental age) Is the patient deaf or have difficulty hearing?: Yes Does the patient have difficulty seeing, even when wearing glasses/contacts?: No Does the patient have difficulty concentrating, remembering, or making decisions?: Yes  Permission Sought/Granted Permission sought to share  information with : Case Manager                Emotional Assessment              Admission diagnosis:  Cystitis [N30.90] Acute saddle pulmonary embolism (HCC) [I26.92] Acute saddle pulmonary embolism without acute cor pulmonale (HCC) [I26.92] Patient Active Problem List   Diagnosis Date Noted   Cystitis 12/14/2023   Acute saddle pulmonary embolism (HCC) 12/13/2023   SOB (shortness of breath)    Pulmonary embolus (HCC) 06/22/2018   Hypokalemia 06/22/2018   Pulmonary embolism (HCC) 06/22/2018   PCP:  Patient, No Pcp Per Pharmacy:   [id number] [place],  - [address] AT [facility] [address] [place] [address] Phone: [phone] Fax: [phone]  [facility] [address] [place], [place] - [address] [place] [address] Phone: [phone] Fax: [phone]     Social Drivers of Health (SDOH) Social History: SDOH Screenings   Food Insecurity: No Food Insecurity (12/14/2023)  Housing: Unknown (12/14/2023)  Transportation Needs: No Transportation Needs (12/14/2023)  Utilities: Not At Risk (12/14/2023)  Social Connections: Patient Declined (12/14/2023)  Tobacco Use: Low Risk  (12/13/2023)   SDOH Interventions:     Readmission Risk Interventions     No data to display

## 2023-12-14 NOTE — Discharge Instructions (Signed)
 Information on my medicine - ELIQUIS (apixaban)  Why was Eliquis prescribed for you? Eliquis was prescribed to treat blood clots that may have been found in the veins of your legs (deep vein thrombosis) or in your lungs (pulmonary embolism) and to reduce the risk of them occurring again.  What do You need to know about Eliquis ? The starting dose is 10 mg (two 5 mg tablets) taken TWICE daily for the FIRST SEVEN (7) DAYS, then on 12/21/2023 (or as indicated by starter pack)  the dose is reduced to ONE 5 mg tablet taken TWICE daily.  Eliquis may be taken with or without food.   Try to take the dose about the same time in the morning and in the evening. If you have difficulty swallowing the tablet whole please discuss with your pharmacist how to take the medication safely.  Take Eliquis exactly as prescribed and DO NOT stop taking Eliquis without talking to the doctor who prescribed the medication.  Stopping may increase your risk of developing a new blood clot.  Refill your prescription before you run out.  After discharge, you should have regular check-up appointments with your healthcare provider that is prescribing your Eliquis.    What do you do if you miss a dose? If a dose of ELIQUIS is not taken at the scheduled time, take it as soon as possible on the same day and twice-daily administration should be resumed. The dose should not be doubled to make up for a missed dose.  Important Safety Information A possible side effect of Eliquis is bleeding. You should call your healthcare provider right away if you experience any of the following: Bleeding from an injury or your nose that does not stop. Unusual colored urine (red or dark brown) or unusual colored stools (red or black). Unusual bruising for unknown reasons. A serious fall or if you hit your head (even if there is no bleeding).  Some medicines may interact with Eliquis and might increase your risk of bleeding or clotting  while on Eliquis. To help avoid this, consult your healthcare provider or pharmacist prior to using any new prescription or non-prescription medications, including herbals, vitamins, non-steroidal anti-inflammatory drugs (NSAIDs) and supplements.  This website has more information on Eliquis (apixaban): http://www.eliquis.com/eliquis/home

## 2023-12-14 NOTE — Progress Notes (Signed)
 Bilateral lower extremity venous duplex has been completed. Preliminary results can be found in CV Proc through chart review.   12/14/23 10:03 AM Olen Cordial RVT

## 2023-12-15 ENCOUNTER — Other Ambulatory Visit (HOSPITAL_COMMUNITY): Payer: Self-pay

## 2023-12-15 DIAGNOSIS — I2692 Saddle embolus of pulmonary artery without acute cor pulmonale: Secondary | ICD-10-CM | POA: Diagnosis not present

## 2023-12-15 LAB — CBC
HCT: 42.5 % (ref 36.0–46.0)
Hemoglobin: 13 g/dL (ref 12.0–15.0)
MCH: 27.2 pg (ref 26.0–34.0)
MCHC: 30.6 g/dL (ref 30.0–36.0)
MCV: 88.9 fL (ref 80.0–100.0)
Platelets: 268 10*3/uL (ref 150–400)
RBC: 4.78 MIL/uL (ref 3.87–5.11)
RDW: 14 % (ref 11.5–15.5)
WBC: 9.2 10*3/uL (ref 4.0–10.5)
nRBC: 0 % (ref 0.0–0.2)

## 2023-12-15 LAB — BASIC METABOLIC PANEL
Anion gap: 9 (ref 5–15)
BUN: 14 mg/dL (ref 8–23)
CO2: 23 mmol/L (ref 22–32)
Calcium: 8.6 mg/dL — ABNORMAL LOW (ref 8.9–10.3)
Chloride: 101 mmol/L (ref 98–111)
Creatinine, Ser: 0.72 mg/dL (ref 0.44–1.00)
GFR, Estimated: >60 mL/min (ref >60)
Glucose, Bld: 124 mg/dL — ABNORMAL HIGH (ref 70–99)
Potassium: 3.7 mmol/L (ref 3.5–5.1)
Sodium: 133 mmol/L — ABNORMAL LOW (ref 135–145)

## 2023-12-15 LAB — URINE CULTURE

## 2023-12-15 MED ORDER — PHENAZOPYRIDINE HCL 100 MG PO TABS
100.0000 mg | ORAL_TABLET | Freq: Three times a day (TID) | ORAL | 0 refills | Status: DC | PRN
  Filled 2023-12-15: qty 10, 4d supply, fill #0

## 2023-12-15 MED ORDER — FOSFOMYCIN TROMETHAMINE 3 G PO PACK
3.0000 g | PACK | Freq: Once | ORAL | Status: AC
  Administered 2023-12-15: 3 g via ORAL
  Filled 2023-12-15: qty 3

## 2023-12-15 MED ORDER — APIXABAN 5 MG PO TABS
ORAL_TABLET | ORAL | 0 refills | Status: DC
  Filled 2023-12-15: qty 120, 54d supply, fill #0

## 2023-12-15 NOTE — Plan of Care (Signed)

## 2023-12-15 NOTE — Discharge Summary (Signed)
 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  On presentation, she was hemodynamically stable, on room air.  UA was suspicious for UTI.  CT abdomen/pelvis showed asymmetrical thickening of anterior urinary bladder concerning for chronic versus acute cystitis.  CTA chest PE study showed moderate to large volume acute saddle pulmonary emboli with no right heart strain.  Started on IV heparin, antibiotic for UTI.   Currently hemodynamically stable.  Anticoagulation changed to Eliquis.  Oxygen weaned to room air.  Medically stable for discharge home today.  Following problems were addressed during the hospitalization:  Acute saddle pulmonary embolism: History of PE about 5 years ago, previously was taking Eliquis.  Presented with shortness of breath, left-sided chest pain.  Found to have large volume acute saddle PE without right heart strain or lung function.    Currently she is hemodynamically stable, on room air.  Echo showed EF of 65 to 70%, grade 1 diastolic dysfunction, normal right ventricle function.  Anticoagulation changed to Eliquis.  Doppler study did not show any DVT.   UTI: Presented with dysuria, left flank pain.  UA suspicious for UTI.  Given ceftriaxone.  Urine culture showed multiple species.  She was given a dose of fosfomycin before discharge today.   Hypertension: Currently blood pressure stable.  Home medications to be continued: amlodipine, losartan   Hypokalemia: Supplemented with potassium   History of delusional  disorder/deafness: On Haldol   Debility: Lives with daughter.  PT consulted,HH recommended     Discharge Diagnoses:  Principal Problem:   Acute saddle pulmonary embolism (HCC) Active Problems:   Cystitis    Discharge Instructions  Discharge Instructions     Diet - low sodium heart healthy   Complete by: As directed    Discharge instructions   Complete by: As directed    1)Please take prescribed medications as instructed 2)Follow up with your PCP in a week.   Increase activity slowly   Complete by: As directed       Allergies as of 12/15/2023       Reactions   Penicillins         Medication List     STOP taking these medications    aspirin EC 81 MG tablet       TAKE these medications    amLODipine 10 MG tablet Commonly known as: NORVASC Take 10 mg by mouth at bedtime.   apixaban 5 MG Tabs tablet Commonly known as: ELIQUIS Take 2 tablets (10 mg total) by mouth 2 (two) times daily for 6 days, THEN 1 tablet (5 mg total) 2 (two) times daily. Start taking on: December 15, 2023   haloperidol 2 MG tablet Commonly known as: HALDOL Take 2 mg by mouth at bedtime.   losartan 50 MG tablet Commonly known as: COZAAR Take 50 mg by mouth at bedtime.   phenazopyridine 100 MG tablet Commonly known as: PYRIDIUM Take 1 tablet (100 mg total) by mouth 3 (three) times daily as needed (bladder spasm).               Durable Medical Equipment  (From admission, onward)  Start     Ordered   12/14/23 1454  For home use only DME 4 wheeled rolling walker with seat  Once       Question:  Patient needs a walker to treat with the following condition  Answer:  Difficulty in walking, not elsewhere classified   12/14/23 1453            Follow-up Information     Angela Able, MD. Schedule an appointment as soon as possible for a visit.   Specialty: Family Medicine Contact information: [address] [place] [address] [phone]          [facility] Follow up.   Specialty: Home Health Services Why: [facility] Contact information: [address] [place] [address] [phone]         [facility] Follow up.   Why: [facility]ator Contact information: [address] [place]               Allergies  Allergen Reactions   Penicillins     Consultations: None   Procedures/Studies: VAS [place] LOWER EXTREMITY VENOUS (DVT) Result Date: 12/14/2023  Lower Venous DVT Study Patient Name:  Angela Becker  Date of Exam:   12/14/2023 Medical Rec #: [id number]     Accession #:    [id number] Date of Birth: [birth year]     Patient Gender: F Patient Age:   23 years Exam Location:  [facility] Procedure:      VAS [place] LOWER EXTREMITY VENOUS (DVT) Referring Phys: PROSPER AMPONSAH --------------------------------------------------------------------------------  Indications: Pain, and pulmonary embolism.  Risk Factors: None identified. Anticoagulation: Heparin. Comparison Study: No prior studies. Performing Technologist: Chanda Busing RVT  Examination Guidelines: A complete evaluation includes B-mode imaging, spectral Doppler, color Doppler, and power Doppler as needed of all accessible portions of each vessel. Bilateral testing is considered an integral part of a complete examination. Limited examinations for reoccurring indications may be performed as noted. The reflux portion of the exam is performed with the patient in reverse Trendelenburg.  +---------+---------------+---------+-----------+----------+--------------+ RIGHT    CompressibilityPhasicitySpontaneityPropertiesThrombus Aging +---------+---------------+---------+-----------+----------+--------------+ CFV      Full           Yes      Yes                                 +---------+---------------+---------+-----------+----------+--------------+ SFJ      Full                                                         +---------+---------------+---------+-----------+----------+--------------+ FV Prox  Full                                                        +---------+---------------+---------+-----------+----------+--------------+ FV Mid   Full                                                        +---------+---------------+---------+-----------+----------+--------------+  FV DistalFull                                                        +---------+---------------+---------+-----------+----------+--------------+ PFV      Full                                                        +---------+---------------+---------+-----------+----------+--------------+ POP      Full           Yes      Yes                                 +---------+---------------+---------+-----------+----------+--------------+ PTV      Full                                                        +---------+---------------+---------+-----------+----------+--------------+ PERO     Full                                                        +---------+---------------+---------+-----------+----------+--------------+   +---------+---------------+---------+-----------+----------+--------------+ LEFT     CompressibilityPhasicitySpontaneityPropertiesThrombus Aging +---------+---------------+---------+-----------+----------+--------------+ CFV      Full           Yes      Yes                                 +---------+---------------+---------+-----------+----------+--------------+ SFJ      Full                                                        +---------+---------------+---------+-----------+----------+--------------+ FV Prox  Full                                                        +---------+---------------+---------+-----------+----------+--------------+ FV Mid   Full                                                         +---------+---------------+---------+-----------+----------+--------------+ FV DistalFull                                                        +---------+---------------+---------+-----------+----------+--------------+  PFV      Full                                                        +---------+---------------+---------+-----------+----------+--------------+ POP      Full           Yes      Yes                                 +---------+---------------+---------+-----------+----------+--------------+ PTV      Full                                                        +---------+---------------+---------+-----------+----------+--------------+ PERO     Full                                                        +---------+---------------+---------+-----------+----------+--------------+     Summary: RIGHT: - There is no evidence of deep vein thrombosis in the lower extremity.  - No cystic structure found in the popliteal fossa.  LEFT: - There is no evidence of deep vein thrombosis in the lower extremity.  - No cystic structure found in the popliteal fossa.  *See table(s) above for measurements and observations. Electronically signed by Coral Else MD on 12/14/2023 at 4:39:54 PM.    Final    ECHOCARDIOGRAM COMPLETE Result Date: 12/14/2023    ECHOCARDIOGRAM REPORT   Patient Name:   LADDIE NAEEM Date of Exam: 12/14/2023 Medical Rec #:  [id number]    Height:       66.0 in Accession #:    [id number]   Weight:       169.8 lb Date of Birth:  1951-01-22    BSA:          1.865 m Patient Age:    72 years     BP:           143/93 mmHg Patient Gender: F            HR:           89 bpm. Exam Location:  Inpatient Procedure: 2D Echo, Cardiac Doppler and Color Doppler (Both Spectral and Color            Flow Doppler were utilized during procedure). Indications:    Pulmonary Embolus I26.09  History:        Patient has no prior history of Echocardiogram examinations.                  Signs/Symptoms:Shortness of Breath. Pulmonary Embolism.  Sonographer:    Webb Laws Referring Phys: [id number] PROSPER M AMPONSAH IMPRESSIONS  1. Left ventricular ejection fraction, by estimation, is 65 to 70%. The left ventricle has normal function. The left ventricle has no regional wall motion abnormalities. There is mild concentric left ventricular hypertrophy. Left ventricular diastolic parameters are consistent with Grade I diastolic dysfunction (impaired relaxation).  2. Right ventricular systolic function is normal.  The right ventricular size is normal.  3. The mitral valve is normal in structure. Trivial mitral valve regurgitation. No evidence of mitral stenosis.  4. The aortic valve is tricuspid. There is mild calcification of the aortic valve. Aortic valve regurgitation is not visualized. No aortic stenosis is present.  5. The inferior vena cava is normal in size with greater than 50% respiratory variability, suggesting right atrial pressure of 3 mmHg. FINDINGS  Left Ventricle: Left ventricular ejection fraction, by estimation, is 65 to 70%. The left ventricle has normal function. The left ventricle has no regional wall motion abnormalities. Strain imaging was not performed. The left ventricular internal cavity  size was normal in size. There is mild concentric left ventricular hypertrophy. Left ventricular diastolic parameters are consistent with Grade I diastolic dysfunction (impaired relaxation). Right Ventricle: The right ventricular size is normal. No increase in right ventricular wall thickness. Right ventricular systolic function is normal. Left Atrium: Left atrial size was normal in size. Right Atrium: Right atrial size was normal in size. Pericardium: There is no evidence of pericardial effusion. Mitral Valve: The mitral valve is normal in structure. Trivial mitral valve regurgitation. No evidence of mitral valve stenosis. Tricuspid Valve: The tricuspid valve is normal in structure. Tricuspid  valve regurgitation is trivial. No evidence of tricuspid stenosis. Aortic Valve: The aortic valve is tricuspid. There is mild calcification of the aortic valve. Aortic valve regurgitation is not visualized. No aortic stenosis is present. Pulmonic Valve: The pulmonic valve was normal in structure. Pulmonic valve regurgitation is trivial. No evidence of pulmonic stenosis. Aorta: The aortic root is normal in size and structure. Venous: The inferior vena cava is normal in size with greater than 50% respiratory variability, suggesting right atrial pressure of 3 mmHg. IAS/Shunts: No atrial level shunt detected by color flow Doppler. Additional Comments: 3D was performed not requiring image post processing on an independent workstation and was normal.  LEFT VENTRICLE PLAX 2D LVIDd:         3.50 cm   Diastology LVIDs:         1.90 cm   LV e' medial:    8.49 cm/s LV PW:         0.80 cm   LV E/e' medial:  8.4 LV IVS:        1.20 cm   LV e' lateral:   8.59 cm/s LVOT diam:     1.80 cm   LV E/e' lateral: 8.3 LV SV:         65 LV SV Index:   35 LVOT Area:     2.54 cm  RIGHT VENTRICLE             IVC RV Basal diam:  3.00 cm     IVC diam: 1.30 cm RV S prime:     14.50 cm/s TAPSE (M-mode): 2.2 cm LEFT ATRIUM             Index        RIGHT ATRIUM          Index LA diam:        2.70 cm 1.45 cm/m   RA Area:     9.33 cm LA Vol (A2C):   23.5 ml 12.60 ml/m  RA Volume:   16.80 ml 9.01 ml/m LA Vol (A4C):   21.6 ml 11.58 ml/m LA Biplane Vol: 24.9 ml 13.35 ml/m  AORTIC VALVE LVOT Vmax:   103.00 cm/s LVOT Vmean:  61.800 cm/s LVOT VTI:    0.255  m  AORTA Ao Root diam: 2.60 cm Ao Asc diam:  2.70 cm MITRAL VALVE MV Area (PHT): 4.24 cm    SHUNTS MV Decel Time: 179 msec    Systemic VTI:  0.26 m MV E velocity: 71.60 cm/s  Systemic Diam: 1.80 cm MV A velocity: 90.60 cm/s MV E/A ratio:  0.79 Arvilla Meres MD Electronically signed by Arvilla Meres MD Signature Date/Time: 12/14/2023/9:54:56 AM    Final    CT ABDOMEN PELVIS W  CONTRAST Result Date: 12/13/2023 CLINICAL DATA:  Abdominal/flank pain, stone suspected; Pulmonary embolism (PE) suspected, high prob. Left-sided abdominal pain. EXAM: CT ANGIOGRAPHY CHEST CT ABDOMEN AND PELVIS WITH CONTRAST TECHNIQUE: Multidetector CT imaging of the chest was performed using the standard protocol during bolus administration of intravenous contrast. Multiplanar CT image reconstructions and MIPs were obtained to evaluate the vascular anatomy. Multidetector CT imaging of the abdomen and pelvis was performed using the standard protocol during bolus administration of intravenous contrast. RADIATION DOSE REDUCTION: This exam was performed according to the departmental dose-optimization program which includes automated exposure control, adjustment of the mA and/or kV according to patient size and/or use of iterative reconstruction technique. CONTRAST:  75mL OMNIPAQUE IOHEXOL 300 MG/ML SOLN; 50mL OMNIPAQUE IOHEXOL 350 MG/ML SOLN COMPARISON:  CT angiography chest from 06/21/2018. FINDINGS: CTA CHEST FINDINGS Cardiovascular: There is moderate-to-large volume acute saddle pulmonary embolism involving the main pulmonary artery with extension into the bilateral lobar and segmental pulmonary arteries. There is also an occlusive thrombus in the left lung lower lobe lobar pulmonary artery. No right heart strain. No lung infarction. There is dilation of the main pulmonary trunk measuring up to 3.0 cm, which is nonspecific but can be seen with pulmonary artery hypertension. Normal cardiac size. No pericardial effusion. No aortic aneurysm. Mediastinum/Nodes: Visualized thyroid gland appears grossly unremarkable. No solid / cystic mediastinal masses. The esophagus is nondistended precluding optimal assessment. There are few mildly prominent mediastinal and hilar lymph nodes, which do not meet the size criteria for lymphadenopathy and though indeterminate most likely benign in etiology. No axillary lymphadenopathy by  size criteria. Lungs/Pleura: The central tracheo-bronchial tree is patent. There are patchy areas of linear, plate-like atelectasis and/or scarring throughout bilateral lungs. There are also dependent changes in bilateral lungs. No mass or consolidation. No pleural effusion or pneumothorax. No suspicious lung nodules. Musculoskeletal: The visualized soft tissues of the chest wall are grossly unremarkable. No suspicious osseous lesions. There are mild multilevel degenerative changes in the visualized spine. Review of the MIP images confirms the above findings. CT ABDOMEN and PELVIS FINDINGS Hepatobiliary: The liver is normal in size. Non-cirrhotic configuration. No suspicious mass. No intrahepatic or extrahepatic bile duct dilation. There are multiple air containing noncalcified gallstones without imaging signs of acute cholecystitis. Normal gallbladder wall thickness. No pericholecystic inflammatory changes. Pancreas: Unremarkable. No pancreatic ductal dilatation or surrounding inflammatory changes. Spleen: Within normal limits. No focal lesion. Adrenals/Urinary Tract: Adrenal glands are unremarkable. No suspicious renal mass. No hydronephrosis. No renal or ureteric calculi. Urinary bladder is partially distended. There is mild asymmetric thickening of the anterior bladder wall without perivesical fat stranding, likely sequela of chronic versus acute cystitis. Correlate clinically and with urinalysis. Stomach/Bowel: No disproportionate dilation of the small or large bowel loops. No evidence of abnormal bowel wall thickening or inflammatory changes. The appendix is unremarkable. There are multiple diverticula throughout the colon, without imaging signs of diverticulitis. Vascular/Lymphatic: No ascites or pneumoperitoneum. No abdominal or pelvic lymphadenopathy, by size criteria. No aneurysmal dilation of the major abdominal  arteries. There are mild peripheral atherosclerotic vascular calcifications of the aorta and  its major branches. Reproductive: The uterus is unremarkable. No large adnexal mass. Other: The visualized soft tissues and abdominal wall are unremarkable. Musculoskeletal: No suspicious osseous lesions. There are mild multilevel degenerative changes in the visualized spine. Review of the MIP images confirms the above findings. IMPRESSION: 1. There is moderate-to-large volume acute saddle pulmonary embolism, as described above. No right heart strain. No lung infarction. 2. There is mild asymmetric thickening of the anterior urinary bladder wall without perivesical fat stranding, likely sequela of chronic versus acute cystitis. Correlate clinically and with urinalysis. 3. Otherwise no acute inflammatory process identified within the abdomen or pelvis. 4. Multiple other nonacute observations, as described above. Critical Value/emergent results were called by telephone at the time of interpretation on 12/13/2023 at 5:18 pm to provider Dr. Gloris Manchester , who verbally acknowledged these results. Electronically Signed   By: Jules Schick M.D.   On: 12/13/2023 17:21   CT Angio Chest PE W and/or Wo Contrast Result Date: 12/13/2023 CLINICAL DATA:  Abdominal/flank pain, stone suspected; Pulmonary embolism (PE) suspected, high prob. Left-sided abdominal pain. EXAM: CT ANGIOGRAPHY CHEST CT ABDOMEN AND PELVIS WITH CONTRAST TECHNIQUE: Multidetector CT imaging of the chest was performed using the standard protocol during bolus administration of intravenous contrast. Multiplanar CT image reconstructions and MIPs were obtained to evaluate the vascular anatomy. Multidetector CT imaging of the abdomen and pelvis was performed using the standard protocol during bolus administration of intravenous contrast. RADIATION DOSE REDUCTION: This exam was performed according to the departmental dose-optimization program which includes automated exposure control, adjustment of the mA and/or kV according to patient size and/or use of iterative  reconstruction technique. CONTRAST:  75mL OMNIPAQUE IOHEXOL 300 MG/ML SOLN; 50mL OMNIPAQUE IOHEXOL 350 MG/ML SOLN COMPARISON:  CT angiography chest from 06/21/2018. FINDINGS: CTA CHEST FINDINGS Cardiovascular: There is moderate-to-large volume acute saddle pulmonary embolism involving the main pulmonary artery with extension into the bilateral lobar and segmental pulmonary arteries. There is also an occlusive thrombus in the left lung lower lobe lobar pulmonary artery. No right heart strain. No lung infarction. There is dilation of the main pulmonary trunk measuring up to 3.0 cm, which is nonspecific but can be seen with pulmonary artery hypertension. Normal cardiac size. No pericardial effusion. No aortic aneurysm. Mediastinum/Nodes: Visualized thyroid gland appears grossly unremarkable. No solid / cystic mediastinal masses. The esophagus is nondistended precluding optimal assessment. There are few mildly prominent mediastinal and hilar lymph nodes, which do not meet the size criteria for lymphadenopathy and though indeterminate most likely benign in etiology. No axillary lymphadenopathy by size criteria. Lungs/Pleura: The central tracheo-bronchial tree is patent. There are patchy areas of linear, plate-like atelectasis and/or scarring throughout bilateral lungs. There are also dependent changes in bilateral lungs. No mass or consolidation. No pleural effusion or pneumothorax. No suspicious lung nodules. Musculoskeletal: The visualized soft tissues of the chest wall are grossly unremarkable. No suspicious osseous lesions. There are mild multilevel degenerative changes in the visualized spine. Review of the MIP images confirms the above findings. CT ABDOMEN and PELVIS FINDINGS Hepatobiliary: The liver is normal in size. Non-cirrhotic configuration. No suspicious mass. No intrahepatic or extrahepatic bile duct dilation. There are multiple air containing noncalcified gallstones without imaging signs of acute  cholecystitis. Normal gallbladder wall thickness. No pericholecystic inflammatory changes. Pancreas: Unremarkable. No pancreatic ductal dilatation or surrounding inflammatory changes. Spleen: Within normal limits. No focal lesion. Adrenals/Urinary Tract: Adrenal glands are unremarkable. No suspicious renal  mass. No hydronephrosis. No renal or ureteric calculi. Urinary bladder is partially distended. There is mild asymmetric thickening of the anterior bladder wall without perivesical fat stranding, likely sequela of chronic versus acute cystitis. Correlate clinically and with urinalysis. Stomach/Bowel: No disproportionate dilation of the small or large bowel loops. No evidence of abnormal bowel wall thickening or inflammatory changes. The appendix is unremarkable. There are multiple diverticula throughout the colon, without imaging signs of diverticulitis. Vascular/Lymphatic: No ascites or pneumoperitoneum. No abdominal or pelvic lymphadenopathy, by size criteria. No aneurysmal dilation of the major abdominal arteries. There are mild peripheral atherosclerotic vascular calcifications of the aorta and its major branches. Reproductive: The uterus is unremarkable. No large adnexal mass. Other: The visualized soft tissues and abdominal wall are unremarkable. Musculoskeletal: No suspicious osseous lesions. There are mild multilevel degenerative changes in the visualized spine. Review of the MIP images confirms the above findings. IMPRESSION: 1. There is moderate-to-large volume acute saddle pulmonary embolism, as described above. No right heart strain. No lung infarction. 2. There is mild asymmetric thickening of the anterior urinary bladder wall without perivesical fat stranding, likely sequela of chronic versus acute cystitis. Correlate clinically and with urinalysis. 3. Otherwise no acute inflammatory process identified within the abdomen or pelvis. 4. Multiple other nonacute observations, as described above. Critical  Value/emergent results were called by telephone at the time of interpretation on 12/13/2023 at 5:18 pm to provider Dr. Gloris Manchester , who verbally acknowledged these results. Electronically Signed   By: Jules Schick M.D.   On: 12/13/2023 17:21   DG Chest Portable 1 View Result Date: 12/13/2023 CLINICAL DATA:  Left lower chest pain EXAM: PORTABLE CHEST - 1 VIEW COMPARISON:  06/28/2018 FINDINGS: Low lung volumes. Coarse interstitial opacities in both lung bases increased from previous. No pneumothorax. Heart size and mediastinal contours are within normal limits. No effusion. Visualized bones unremarkable. IMPRESSION: Low volumes with bibasilar interstitial opacities. Electronically Signed   By: Corlis Leak M.D.   On: 12/13/2023 15:06      Subjective: Patient seen and examined at bedside today.  Hemodynamically stable.  Comfortable.  Denies any shortness of breath or cough at rest.  Saturating fine on room air.  Medically stable for discharge.Called and discussed with Dr. [name] on phone and discussed about discharge planning  Discharge Exam: Vitals:   12/15/23 0543 12/15/23 0815  BP: (!) 140/87 125/78  Pulse: (!) 108 99  Resp: 20   Temp: (!) 97.5 F (36.4 C) 97.8 F (36.6 C)  SpO2: 90%    Vitals:   12/14/23 1714 12/14/23 1959 12/15/23 0543 12/15/23 0815  BP:  (!) 146/98 (!) 140/87 125/78  Pulse:  (!) 110 (!) 108 99  Resp:  20 20   Temp: 98.3 F (36.8 C) 99.3 F (37.4 C) (!) 97.5 F (36.4 C) 97.8 F (36.6 C)  TempSrc: Oral  Oral Oral  SpO2:  93% 90%   Weight:      Height:        General: Pt is alert, awake, not in acute distress Cardiovascular: RRR, S1/S2 +, no rubs, no gallops Respiratory: CTA bilaterally, no wheezing, no rhonchi Abdominal: Soft, NT, ND, bowel sounds + Extremities: no edema, no cyanosis    The results of significant diagnostics from this hospitalization (including imaging, microbiology, ancillary and laboratory) are listed below for reference.      Microbiology: Recent Results (from the past 240 hours)  Urine Culture     Status: Abnormal   Collection Time: 12/13/23  2:06  PM   Specimen: Urine, Clean Catch  Result Value Ref Range Status   Specimen Description   Final    URINE, CLEAN CATCH Performed at [facility], [address] [place], [place] [address]    Special Requests   Final    NONE Performed at [facility], [address] [place], [place] [address]    Culture MULTIPLE SPECIES PRESENT, SUGGEST RECOLLECTION (A)  Final   Report Status 12/15/2023 FINAL  Final     Labs: BNP (last 3 results) No results for input(s): "BNP" in the last 8760 hours. Basic Metabolic Panel: Recent Labs  Lab 12/13/23 1406 12/14/23 0449 12/15/23 0540  NA 137 137 133*  K 4.0 3.3* 3.7  CL 101 103 101  CO2 23 24 23   GLUCOSE 138* 114* 124*  BUN 17 16 14   CREATININE 1.02* 0.60 0.72  CALCIUM 8.9 8.6* 8.6*   Liver Function Tests: Recent Labs  Lab 12/13/23 1406 12/14/23 0449  AST 30 17  ALT 19 13  ALKPHOS 169* 126  BILITOT 1.1 0.8  PROT 9.3* 7.6  ALBUMIN 4.1 3.4*   Recent Labs  Lab 12/13/23 1406  LIPASE 24   No results for input(s): "AMMONIA" in the last 168 hours. CBC: Recent Labs  Lab 12/14/23 0449 12/15/23 0540  WBC 8.1 9.2  HGB 12.2 13.0  HCT 38.4 42.5  MCV 85.9 88.9  PLT 228 268   Cardiac Enzymes: No results for input(s): "CKTOTAL", "CKMB", "CKMBINDEX", "TROPONINI" in the last 168 hours. BNP: Invalid input(s): "POCBNP" CBG: No results for input(s): "GLUCAP" in the last 168 hours. D-Dimer No results for input(s): "DDIMER" in the last 72 hours. Hgb A1c No results for input(s): "HGBA1C" in the last 72 hours. Lipid Profile No results for input(s): "CHOL", "HDL", "LDLCALC", "TRIG", "CHOLHDL", "LDLDIRECT" in the last 72 hours. Thyroid function studies No results for input(s): "TSH", "T4TOTAL", "T3FREE", "THYROIDAB" in the last 72 hours.  Invalid input(s):  "FREET3" Anemia work up No results for input(s): "VITAMINB12", "FOLATE", "FERRITIN", "TIBC", "IRON", "RETICCTPCT" in the last 72 hours. Urinalysis    Component Value Date/Time   COLORURINE AMBER (A) 12/13/2023 1406   APPEARANCEUR CLOUDY (A) 12/13/2023 1406   LABSPEC 1.030 12/13/2023 1406   PHURINE 5.0 12/13/2023 1406   GLUCOSEU NEGATIVE 12/13/2023 1406   HGBUR NEGATIVE 12/13/2023 1406   BILIRUBINUR SMALL (A) 12/13/2023 1406   KETONESUR NEGATIVE 12/13/2023 1406   PROTEINUR 100 (A) 12/13/2023 1406   NITRITE NEGATIVE 12/13/2023 1406   LEUKOCYTESUR SMALL (A) 12/13/2023 1406   Sepsis Labs Recent Labs  Lab 12/14/23 0449 12/15/23 0540  WBC 8.1 9.2   Microbiology Recent Results (from the past 240 hours)  Urine Culture     Status: Abnormal   Collection Time: 12/13/23  2:06 PM   Specimen: Urine, Clean Catch  Result Value Ref Range Status   Specimen Description   Final    URINE, CLEAN CATCH Performed at [facility], [address], [place], [place] [address]    Special Requests   Final    NONE Performed at [facility], [address] [place], [place] [address]    Culture MULTIPLE SPECIES PRESENT, SUGGEST RECOLLECTION (A)  Final   Report Status 12/15/2023 FINAL  Final    Please note: You were cared for by a hospitalist during your hospital stay. Once you are discharged, your primary care physician will handle any further medical issues. Please note that NO REFILLS for any discharge medications will  be authorized once you are discharged, as it is imperative that you return to your primary care physician (or establish a relationship with a primary care physician if you do not have one) for your post hospital discharge needs so that they can reassess your need for medications and monitor your lab values.    Time coordinating discharge: 40 minutes  SIGNED:   Burnadette Pop, MD  [facility] 12/15/2023, 10:25 AM Pager  [phone]  If 7PM-7AM, please contact night-coverage [email or web address] Password [email or web address]

## 2023-12-15 NOTE — TOC Transition Note (Addendum)
 Transition of Care [facility]) - Discharge Note   Patient Details  Name: Angela Becker MRN: [id number] Date of Birth: 09/21/1951  [facility]) CM/SW Contact:  Lanier Clam, RN Phone Number: 12/15/2023, 12:54 PM   Clinical Narrative:Ordered for home 02-[employer] rep Jermaine to deliver travel tank to rm prior d/c.  -3p-spoke to dtr [name] she is aware of d/c plans with home 02-agree-rep Mittie Bodo will contact her directly with her concerns-they will deliver home 02 travel tank to rm prior d/c. [name] will transport home on own.      Final next level of care: Home w Home Health Services Barriers to Discharge: No Barriers Identified   Patient Goals and CMS Choice Patient states their goals for this hospitalization and ongoing recovery are:: Home CMS Medicare.gov Compare Post Acute Care list provided to:: Patient Choice offered to / list presented to : Patient [place] ownership interest in [facility].provided to:: Patient    Discharge Placement                       Discharge Plan and Services Additional resources added to the After Visit Summary for     Discharge Planning Services: CM Consult Post Acute Care Choice: Resumption of Svcs/PTA Provider          DME Arranged: Oxygen DME Agency: [employer] Date DME Agency Contacted: 12/15/23 Time DME Agency Contacted: 1254 Representative spoke with at DME Agency: Vaughan Basta HH Arranged: PT HH Agency: [facility] Date [facility] Agency Contacted: 12/15/23 Time HH Agency Contacted: 1005 Representative spoke with at [facility] Agency: Cindie  Social Drivers of Health (SDOH) Interventions SDOH Screenings   Food Insecurity: No Food Insecurity (12/14/2023)  Housing: Unknown (12/14/2023)  Transportation Needs: No Transportation Needs (12/14/2023)  Utilities: Not At Risk (12/14/2023)  Social Connections: Patient Declined (12/14/2023)  Tobacco Use: Low Risk  (12/13/2023)     Readmission Risk  Interventions     No data to display

## 2023-12-15 NOTE — Progress Notes (Signed)
 SATURATION QUALIFICATIONS: (This note is used to comply with regulatory documentation for home oxygen)  Patient Saturations on Room Air at Rest = 89%  Patient Saturations on Room Air while Ambulating = Did not attempt with resting RA SPO2.  Patient Saturations on 2 Liters of oxygen while Ambulating = 93%  Please briefly explain why patient needs home oxygen:  At rest pt remains below 90% on RA.

## 2023-12-15 NOTE — Progress Notes (Signed)
 Discharge medications hand delivered from outpatient [facility] pharmacy by this RN

## 2023-12-15 NOTE — TOC CM/SW Note (Signed)
    Durable Medical Equipment  (From admission, onward)           Start     Ordered   12/15/23 1211  For home use only DME oxygen  Once       Question Answer Comment  Length of Need Lifetime   Mode or (Route) Nasal cannula   Liters per Minute 2   Frequency Continuous (stationary and portable oxygen unit needed)   Oxygen delivery system Gas      12/15/23 1210   12/14/23 1454  For home use only DME 4 wheeled rolling walker with seat  Once       Question:  Patient needs a walker to treat with the following condition  Answer:  Difficulty in walking, not elsewhere classified   12/14/23 1453

## 2023-12-15 NOTE — TOC Transition Note (Signed)
 Transition of Care [facility]) - Discharge Note   Patient Details  Name: Angela Becker MRN: [id number] Date of Birth: 1951-01-02  Transition of Care [facility]) CM/SW Contact:  Lanier Clam, RN Phone Number: 12/15/2023, 10:05 AM   Clinical Narrative: See prior RNCM note. Already discussed d/c plans using sign interpreter.D/c home w/HHC/dme [employer] rollator to be delivered to rm prior d/c.No further CM needs.      Final next level of care: Home w Home Health Services Barriers to Discharge: No Barriers Identified   Patient Goals and CMS Choice Patient states their goals for this hospitalization and ongoing recovery are:: Home CMS Medicare.gov Compare Post Acute Care list provided to:: Patient Choice offered to / list presented to : Patient [place] ownership interest in [facility]provided to:: Patient    Discharge Placement                       Discharge Plan and Services Additional resources added to the After Visit Summary for     Discharge Planning Services: CM Consult Post Acute Care Choice: Resumption of Svcs/PTA Provider          DME Arranged: Walker rolling with seat DME Agency: [employer]       HH Arranged: PT [facility] Agency: [facility] Date [facility] Agency Contacted: 12/15/23 Time HH Agency Contacted: 1005 Representative spoke with at [facility] Agency: Cindie  Social Drivers of Health (SDOH) Interventions SDOH Screenings   Food Insecurity: No Food Insecurity (12/14/2023)  Housing: Unknown (12/14/2023)  Transportation Needs: No Transportation Needs (12/14/2023)  Utilities: Not At Risk (12/14/2023)  Social Connections: Patient Declined (12/14/2023)  Tobacco Use: Low Risk  (12/13/2023)     Readmission Risk Interventions     No data to display

## 2024-05-01 ENCOUNTER — Other Ambulatory Visit (HOSPITAL_COMMUNITY): Payer: Self-pay

## 2024-05-01 NOTE — Progress Notes (Unsigned)
 Location:  [facility]  Provider:  Bari America, ANP [facility] [phone]    Goals of Care:     12/14/2023    1:06 AM  Advanced Directives  Does Patient Have a Medical Advance Directive? No  Would patient like information on creating a medical advance directive? No - Guardian declined     Chief Complaint  Patient presents with   New Patient (Initial Visit)    Discuss medication, here with daughter, Naomie, and interrupter. Patient c/o right knee pain, no known injury. Pill bottles present at initial appointment.     HPI:   Here to establish care Discussed the use of AI scribe software for clinical note transcription with the patient, who gave verbal consent to proceed.  History of Present Illness   Angela Becker is a 73 year old female with a history of pulmonary embolism and hearing loss who presents to establish care. She is accompanied by her daughter, Naomie, who is her primary caregiver.  She has a history of acute saddle pulmonary embolism, with the most recent event occurring in February. She was restarted on Eliquis  at that time. Since then, she has not experienced any bleeding or bruising, but sometimes has swelling in her feet. She has not yet seen a hematologist for further evaluation. Her brother also had a history of blood clots, raising concerns about a possible hereditary component.  She has been hearing impaired since birth, with no known genetic cause. Hearing aids have been evaluated but found to be non-functional. Some cognition issues can read but not able to understand well.    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 She has a history of seeing things like 'cats in the ceiling' and 'monkeys'. She was aggressive and violent at times, but these symptoms improved with medication. She continues to follow up with a psychiatrist for medication management.  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 This was discovered after noticing changes in her vision and lack of peripheral vision.  She reports right knee pain, for which she takes Tylenol . The pain does not affect her ability to walk. She also experiences some leg swelling.  She has a history of high blood pressure, which is currently well-controlled with medication. She also has a history of rectus diastasis, but no surgery was recommended as it is a separated muscle. Her bowel movements are regular, and she has no current urinary issues, although she sometimes experiences incontinence and uses a pad for leakage.  She has been separated from her husband for over 30 years and lives with her daughter. She graduated from high school and attended the [employer] for [employer] in [place]. 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       Acute saddle PE note don CT scna 12/13/23. Started back on eliquis .   Mammogram 01/19/24 negative Dexa scan: needed Past Medical History:  Diagnosis Date   Blind right eye 05/02/2024   Deaf 05/02/2024   HTN (hypertension) 05/02/2024   Hypertension    Osteoarthritis 05/02/2024   Right knee     Pulmonary embolus (HCC) 06/22/2018   Retinal detachment 05/02/2024   Blind in right eye     Varicose veins of both lower extremities 05/02/2024    History reviewed. No pertinent surgical history.  Allergies  Allergen Reactions   Penicillins  Outpatient Encounter Medications as of 05/02/2024  Medication Sig   acetaminophen  (TYLENOL ) 500 MG tablet Take 1,000 mg by mouth 2 (two) times daily.   amLODipine  (NORVASC ) 10 MG tablet Take 10 mg by mouth at bedtime.   apixaban  (ELIQUIS ) 5 MG TABS tablet Take 5 mg by mouth 2 (two) times daily.   ascorbic acid (VITAMIN C) 500 MG tablet Take 500 mg by mouth daily.   haloperidol  (HALDOL ) 2 MG tablet Take 2 mg by mouth at bedtime.   losartan  (COZAAR ) 50 MG tablet  Take 50 mg by mouth at bedtime.   Menthol, Topical Analgesic, (BIOFREEZE EX) Apply 1 Application topically 3 (three) times daily as needed.   apixaban  (ELIQUIS ) 5 MG TABS tablet Take 2 tablets (10 mg total) by mouth 2 (two) times daily for 6 days, THEN 1 tablet (5 mg total) 2 (two) times daily. (Patient not taking: No sig reported)   phenazopyridine  (PYRIDIUM ) 100 MG tablet Take 1 tablet (100 mg total) by mouth 3 (three) times daily as needed (bladder spasm). (Patient not taking: Reported on 05/02/2024)   No facility-administered encounter medications on file as of 05/02/2024.    Review of Systems:  Review of Systems  Constitutional:  Negative for activity change, appetite change, chills, diaphoresis, fatigue, fever and unexpected weight change.  HENT:  Positive for hearing loss. Negative for congestion.   Eyes:        Blind right eye  Respiratory:  Negative for cough, shortness of breath and wheezing.   Cardiovascular:  Positive for leg swelling. Negative for chest pain and palpitations.  Gastrointestinal:  Negative for abdominal distention, abdominal pain, constipation and diarrhea.  Genitourinary:  Negative for difficulty urinating and dysuria.  Musculoskeletal:  Positive for arthralgias and gait problem. Negative for back pain, joint swelling and myalgias.  Neurological:  Negative for dizziness, tremors, seizures, syncope, facial asymmetry, speech difficulty, weakness, light-headedness, numbness and headaches.  Psychiatric/Behavioral:  Negative for agitation, behavioral problems and confusion.        Hallucinations    Health Maintenance  Topic Date Due   Medicare Annual Wellness (AWV)  Never done   Hepatitis C Screening  Never done   DTaP/Tdap/Td (1 - Tdap) Never done   Pneumococcal Vaccine: 50+ Years (1 of 2 - PCV) 02/10/1972   Colonoscopy  Never done   Zoster Vaccines- Shingrix (1 of 2) 02/10/2003   DEXA SCAN  Never done   COVID-19 Vaccine (4 - 2024-25 season) 06/19/2023    INFLUENZA VACCINE  05/18/2024   MAMMOGRAM  10/28/2024   Hepatitis B Vaccines  Aged Out   HPV VACCINES  Aged Out   Meningococcal B Vaccine  Aged Out    Physical Exam: Vitals:   05/02/24 1300  BP: 118/76  Pulse: 82  Temp: (!) 97.3 F (36.3 C)  TempSrc: Temporal  SpO2: 93%  Weight: 164 lb (74.4 kg)  Height: 5' 6 (1.676 m)   Body mass index is 26.47 kg/m. Physical Exam Vitals reviewed.  Constitutional:      General: She is not in acute distress.    Appearance: She is not diaphoretic.  HENT:     Head: Normocephalic and atraumatic.     Right Ear: Tympanic membrane and ear canal normal. There is no impacted cerumen.     Left Ear: Ear canal normal. There is impacted cerumen.     Nose: Nose normal.     Mouth/Throat:     Mouth: Mucous membranes are moist.     Pharynx: Oropharynx is  clear.  Eyes:     Conjunctiva/sclera: Conjunctivae normal.     Comments: Right pupil minimal reaction   Neck:     Vascular: No JVD.  Cardiovascular:     Rate and Rhythm: Normal rate and regular rhythm.     Heart sounds: No murmur heard. Pulmonary:     Effort: Pulmonary effort is normal. No respiratory distress.     Breath sounds: Normal breath sounds. No wheezing.  Abdominal:     General: Abdomen is flat. Bowel sounds are normal. There is no distension.     Palpations: Abdomen is soft.     Tenderness: There is no abdominal tenderness.     Comments: Rectus diastasis  Musculoskeletal:     Comments: Trace ankle edema Varicose veins. Crepitus to both knees No swelling to either knee  Skin:    General: Skin is warm and dry.  Neurological:     General: No focal deficit present.     Mental Status: She is alert and oriented to person, place, and time. Mental status is at baseline.  Psychiatric:        Mood and Affect: Mood normal.     Labs reviewed: Basic Metabolic Panel: Recent Labs    12/13/23 1406 12/14/23 0449 12/15/23 0540  NA 137 137 133*  K 4.0 3.3* 3.7  CL 101 103 101  CO2  23 24 23   GLUCOSE 138* 114* 124*  BUN 17 16 14   CREATININE 1.02* 0.60 0.72  CALCIUM 8.9 8.6* 8.6*   Liver Function Tests: Recent Labs    12/13/23 1406 12/14/23 0449  AST 30 17  ALT 19 13  ALKPHOS 169* 126  BILITOT 1.1 0.8  PROT 9.3* 7.6  ALBUMIN 4.1 3.4*   Recent Labs    12/13/23 1406  LIPASE 24   No results for input(s): AMMONIA in the last 8760 hours. CBC: Recent Labs    12/14/23 0449 12/15/23 0540  WBC 8.1 9.2  HGB 12.2 13.0  HCT 38.4 42.5  MCV 85.9 88.9  PLT 228 268   Lipid Panel: No results for input(s): CHOL, HDL, LDLCALC, TRIG, CHOLHDL, LDLDIRECT in the last 8760 hours. No results found for: HGBA1C  Procedures since last visit: No results found.  Assessment/Plan  Assessment and Plan    Pulmonary Embolism (PE) Recurrent PE, currently on Eliquis . Discussed lifelong anticoagulation and need for hematology evaluation for genetic predisposition. - Refer to hematology for evaluation of recurrent PE and potential genetic predisposition. - Continue Eliquis  for anticoagulation.  Varicose Veins Varicose veins causing mild leg swelling. Discussed compression hose for management. - Consider compression hose if swelling increases.  Knee Osteoarthritis Right knee osteoarthritis with pain and crepitus. Limited treatment options due to anticoagulation. Discussed Tylenol  and topical analgesics. - Recommend Tylenol  for pain management. - Suggest topical analgesics like Biofreeze for additional relief. - Consider orthopedic referral if symptoms worsen.  Delusions and Hallucinations Delusions and hallucinations well-managed with medication and psychiatric follow-up. - Continue psychiatric follow-up for medication management.  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 Discussed Debrox for earwax removal. - Recommend Debrox for earwax softening and irrigation at home. - Offer in-office ear irrigation if home treatment is  ineffective.  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  Hypertension Hypertension well-controlled with medication. - Continue current antihypertensive regimen.  Dental Care Discussed dental care and need for regular check-ups. - Encourage regular dental check-ups.        Labs/tests ordered:  *  No order type specified *will need routine labs at next visit  Next appt:  f/u 2-4 weeks   Total time :  time greater than 50% of total time spent doing pt counseling and coordination of care

## 2024-05-02 ENCOUNTER — Ambulatory Visit: Admitting: Adult Health

## 2024-05-02 ENCOUNTER — Encounter: Payer: Self-pay | Admitting: Adult Health

## 2024-05-02 VITALS — BP 118/76 | HR 82 | Temp 97.3°F | Ht 66.0 in | Wt 164.0 lb

## 2024-05-02 DIAGNOSIS — M1711 Unilateral primary osteoarthritis, right knee: Secondary | ICD-10-CM

## 2024-05-02 DIAGNOSIS — H5440 Blindness, one eye, unspecified eye: Secondary | ICD-10-CM

## 2024-05-02 DIAGNOSIS — I2602 Saddle embolus of pulmonary artery with acute cor pulmonale: Secondary | ICD-10-CM

## 2024-05-02 DIAGNOSIS — I10 Essential (primary) hypertension: Secondary | ICD-10-CM

## 2024-05-02 DIAGNOSIS — H9190 Unspecified hearing loss, unspecified ear: Secondary | ICD-10-CM | POA: Insufficient documentation

## 2024-05-02 DIAGNOSIS — I8393 Asymptomatic varicose veins of bilateral lower extremities: Secondary | ICD-10-CM

## 2024-05-02 DIAGNOSIS — M1990 Unspecified osteoarthritis, unspecified site: Secondary | ICD-10-CM | POA: Insufficient documentation

## 2024-05-02 DIAGNOSIS — H3321 Serous retinal detachment, right eye: Secondary | ICD-10-CM

## 2024-05-02 DIAGNOSIS — H9193 Unspecified hearing loss, bilateral: Secondary | ICD-10-CM

## 2024-05-02 DIAGNOSIS — H3320 Serous retinal detachment, unspecified eye: Secondary | ICD-10-CM | POA: Insufficient documentation

## 2024-05-16 ENCOUNTER — Encounter: Payer: Self-pay | Admitting: Family

## 2024-05-16 ENCOUNTER — Ambulatory Visit (INDEPENDENT_AMBULATORY_CARE_PROVIDER_SITE_OTHER): Admitting: Family

## 2024-05-16 VITALS — BP 124/68 | HR 66 | Temp 97.5°F | Ht 66.0 in | Wt 163.0 lb

## 2024-05-16 DIAGNOSIS — N3942 Incontinence without sensory awareness: Secondary | ICD-10-CM | POA: Diagnosis not present

## 2024-05-16 DIAGNOSIS — Z86711 Personal history of pulmonary embolism: Secondary | ICD-10-CM

## 2024-05-16 DIAGNOSIS — I8393 Asymptomatic varicose veins of bilateral lower extremities: Secondary | ICD-10-CM

## 2024-05-16 DIAGNOSIS — I10 Essential (primary) hypertension: Secondary | ICD-10-CM | POA: Diagnosis not present

## 2024-05-16 DIAGNOSIS — E782 Mixed hyperlipidemia: Secondary | ICD-10-CM

## 2024-05-16 MED ORDER — LOSARTAN POTASSIUM 50 MG PO TABS: 50.0000 mg | ORAL_TABLET | Freq: Every day | ORAL | 1 refills | Status: AC

## 2024-05-16 MED ORDER — AMLODIPINE BESYLATE 10 MG PO TABS: 10.0000 mg | ORAL_TABLET | Freq: Every day | ORAL | 1 refills | Status: AC

## 2024-05-16 MED ORDER — APIXABAN 5 MG PO TABS: 5.0000 mg | ORAL_TABLET | Freq: Two times a day (BID) | ORAL | 1 refills | Status: AC

## 2024-05-16 NOTE — Progress Notes (Signed)
 Provider: Roxan Plough FNP-C   Mihika Surrette, Roxan BROCKS, NP  Patient Care Team: Ottis Sarnowski, Roxan BROCKS, NP as PCP - General (Family Medicine)  Extended Emergency Contact Information Primary Emergency Contact: Gray,[name]  [place] Home Phone: [phone] Relation: Daughter Secondary Emergency Contact: Boyce,Sirvantus  [place] Home Phone: [phone] Relation: Son  Code Status:  Full Code  Goals of care: Advanced Directive information    12/14/2023    1:06 AM  Advanced Directives  Does Patient Have a Medical Advance Directive? No  Would patient like information on creating a medical advance directive? No - Guardian declined     Chief Complaint  Patient presents with   Medical Management of Chronic Issues    2 week follow up      Discussed the use of AI scribe software for clinical note transcription with the patient, who gave verbal consent to proceed.  History of Present Illness   Angela Becker is a 73 year old female with a history of pulmonary embolism who presents for follow-up care.  She has a history of pulmonary embolism, with the most recent event occurring in February of this year. She is currently taking Eliquis  and has not experienced further episodes of shortness of breath or pain since starting the medication. No blood in stool is reported.  She has varicose veins without associated pain. She has not yet been fitted for compression stockings.  She experiences right knee arthritis and takes Tylenol  for pain management. She reports some pain but continues with the medication.  She has a history of delusions and hallucinations, which have resolved with the use of haloperidol . She is under the care of a psychiatrist through [facility] services and her medication dosage has been reduced significantly.  She reports a good appetite without skipping meals and has not experienced any issues with urination or constipation.  She has not had any  recent lab work since February and is due for a recheck of her CBC, cholesterol, and thyroid levels.   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 No pertinent surgical history.  Allergies  Allergen Reactions   Penicillins     Allergies as of 05/16/2024       Reactions   Penicillins         Medication List        Accurate as of May 16, 2024 10:44 PM. If you have any questions, ask your nurse or doctor.          acetaminophen  500 MG tablet Commonly known as: TYLENOL  Take 1,000 mg by mouth 2 (two) times daily.   amLODipine  10 MG tablet Commonly known as: NORVASC  Take 1 tablet (10 mg total) by mouth at bedtime.   ascorbic acid 500 MG tablet Commonly known as: VITAMIN C Take 500 mg by mouth daily.   BIOFREEZE EX Apply 1 Application topically 3 (three) times daily as needed.   Eliquis  5 MG Tabs tablet Generic drug: apixaban  Take 2 tablets (10 mg total) by mouth 2 (two) times daily for 6 days, THEN 1 tablet (5 mg total) 2 (two) times daily. Start taking on: December 15, 2023  apixaban  5 MG Tabs tablet Commonly known as: Eliquis  Take 1 tablet (5 mg total) by mouth 2 (two) times daily.   haloperidol  2 MG tablet Commonly known as: HALDOL  Take 2 mg by mouth at bedtime.   losartan  50 MG tablet Commonly known as: COZAAR  Take 1 tablet (50 mg total) by mouth at bedtime.   phenazopyridine  100 MG tablet Commonly known as: PYRIDIUM  Take 1 tablet (100 mg total) by mouth 3 (three) times daily as needed (bladder spasm).               Durable Medical Equipment  (From admission, onward)           Start     Ordered   05/16/24 0000  For home use only DME  Other see comment       Comments: Incontinent pads and chucks  Sig. Use six  daily for incontinent Dx: Urine incontinent  Question:  Length of Need  Answer:  Lifetime   05/16/24 1506            Review of Systems  Constitutional:  Negative for appetite change, chills, fatigue, fever and unexpected weight change.  HENT:  Negative for congestion, dental problem, ear discharge, ear pain, facial swelling, hearing loss, nosebleeds, postnasal drip, rhinorrhea, sinus pressure, sinus pain, sneezing, sore throat, tinnitus and trouble swallowing.   Eyes:  Negative for pain, discharge, redness, itching and visual disturbance.  Respiratory:  Negative for cough, chest tightness, shortness of breath and wheezing.   Cardiovascular:  Negative for chest pain, palpitations and leg swelling.  Gastrointestinal:  Negative for abdominal distention, abdominal pain, blood in stool, constipation, diarrhea, nausea and vomiting.  Endocrine: Negative for cold intolerance, heat intolerance, polydipsia, polyphagia and polyuria.  Genitourinary:  Negative for difficulty urinating, dysuria, flank pain, frequency and urgency.  Musculoskeletal:  Negative for arthralgias, back pain, gait problem, joint swelling, myalgias, neck pain and neck stiffness.  Skin:  Negative for color change, pallor, rash and wound.  Neurological:  Negative for dizziness, syncope, speech difficulty, weakness, light-headedness, numbness and headaches.  Hematological:  Does not bruise/bleed easily.  Psychiatric/Behavioral:  Negative for agitation, behavioral problems, confusion, hallucinations, self-injury, sleep disturbance and suicidal ideas. The patient is not nervous/anxious.     Immunization History  Administered Date(s) Administered   Fluzone Influenza virus vaccine,trivalent (IIV3), split virus 08/31/2004, 09/13/2005   Influenza-Unspecified 08/17/2011, 10/13/2012, 08/12/2022   Moderna Covid-19 Vaccine Bivalent Booster 18yrs & up  12/25/2019, 01/22/2020, 10/15/2020   Pneumococcal-Unspecified 06/12/2022   Zoster, Unspecified 06/12/2022, 08/12/2022   Pertinent  Health Maintenance Due  Topic Date Due   Colonoscopy  Never done   DEXA SCAN  Never done   INFLUENZA VACCINE  05/18/2024   MAMMOGRAM  10/28/2024      05/02/2024   12:57 PM  Fall Risk  Falls in the past year? 0  Was there an injury with Fall? 0  Fall Risk Category Calculator 0  Patient at Risk for Falls Due to No Fall Risks  Fall risk Follow up Falls evaluation completed   Functional Status Survey:    Vitals:   05/16/24 1433  BP: 124/68  Pulse: 66  Temp: (!) 97.5 F (36.4 C)  SpO2: 94%  Weight: 163 lb (73.9 kg)  Height: 5' 6 (1.676 m)   Body mass index is 26.31 kg/m. Physical Exam  VITALS: T- 97.5, P- 66, BP- 124/68, SaO2- 94% MEASUREMENTS: Weight- 163. GENERAL: Alert, cooperative, well developed, no acute distress. HEENT: Normocephalic, normal oropharynx, moist mucous membranes.deaf CHEST:  Clear to auscultation bilaterally, no wheezes, rhonchi, or crackles. CARDIOVASCULAR: Normal heart rate and rhythm, S1 and S2 normal without murmurs. ABDOMEN: Soft, non-tender, non-distended, without organomegaly, normal bowel sounds. EXTREMITIES: No cyanosis or edema. MUSCULOSKELETAL: Right knee with arthritis, some pain present. NEUROLOGICAL: Cranial nerves grossly intact, moves all extremities without gross motor or sensory deficit.    Labs reviewed: Recent Labs    12/13/23 1406 12/14/23 0449 12/15/23 0540  NA 137 137 133*  K 4.0 3.3* 3.7  CL 101 103 101  CO2 23 24 23   GLUCOSE 138* 114* 124*  BUN 17 16 14   CREATININE 1.02* 0.60 0.72  CALCIUM 8.9 8.6* 8.6*   Recent Labs    12/13/23 1406 12/14/23 0449  AST 30 17  ALT 19 13  ALKPHOS 169* 126  BILITOT 1.1 0.8  PROT 9.3* 7.6  ALBUMIN 4.1 3.4*   Recent Labs    12/14/23 0449 12/15/23 0540  WBC 8.1 9.2  HGB 12.2 13.0  HCT 38.4 42.5  MCV 85.9 88.9  PLT 228 268   No results  found for: TSH No results found for: HGBA1C No results found for: CHOL, HDL, LDLCALC, LDLDIRECT, TRIG, CHOLHDL  Significant Diagnostic Results in last 30 days:  No results found.  Assessment/Plan Assessment and Plan    Pulmonary embolism Currently on Eliquis  with no recent shortness of breath, pain, or blood in stool. Well-managed on anticoagulation therapy. - Continue Eliquis  - Monitor for signs of bleeding, including blood in stool - Coordinate with dentist for procedures requiring temporary discontinuation of Eliquis   Varicose veins of lower extremity Presence of varicose veins without pain. Compression stockings recommended. - Recommend wearing below-knee compression stockings - Refer to vein and vascular specialist for evaluation and potential treatment  Right knee osteoarthritis Chronic condition with ongoing pain managed with Tylenol . - Continue Tylenol  for pain management  Urinary incontinence Experiencing urinary incontinence, using pads for management. Inquiry about insurance coverage for incontinence supplies. - Write prescription for incontinence supplies and submit to DME company for insurance coverage - Use six pads per day and one flat chuck at night  Delusions and hallucinations Well-managed on low-dose haldol  with ongoing psychiatric care. - Continue current psychiatric care with [facility] services - Monitor for changes in symptoms  Hypertension Blood pressure well-controlled at 124/68 mmHg on amlodipine . - Continue amlodipine  - Send prescription to [facility] for a 90-day supply with one refill  Hyperlipidemia, unspecified Plan to screen for high cholesterol. - Order lipid panel to screen for hyperlipidemia   Family/ staff Communication: Reviewed plan of care with patient verbalized understanding   Labs/tests ordered:  - CBC with Differential/Platelet - CMP with eGFR([facility]) - TSH - Lipid panel  Next Appointment : Return in about 6 months  (around 11/16/2024) for medical mangement of chronic issues.SABRA   Spent 30 minutes of Face to face and non-face to face with patient  >50% time spent counseling; reviewing medical record; tests; labs; documentation and developing future plan of care.   Roxan JAYSON Plough, NP

## 2024-05-17 ENCOUNTER — Ambulatory Visit: Payer: Self-pay | Admitting: Family

## 2024-05-18 ENCOUNTER — Other Ambulatory Visit: Payer: Self-pay

## 2024-05-18 DIAGNOSIS — T50905A Adverse effect of unspecified drugs, medicaments and biological substances, initial encounter: Secondary | ICD-10-CM

## 2024-05-18 DIAGNOSIS — E039 Hypothyroidism, unspecified: Secondary | ICD-10-CM

## 2024-05-18 DIAGNOSIS — E7800 Pure hypercholesterolemia, unspecified: Secondary | ICD-10-CM

## 2024-05-18 LAB — COMPLETE METABOLIC PANEL WITHOUT GFR
AG Ratio: 1.1 (calc) (ref 1.0–2.5)
ALT: 15 U/L (ref 6–29)
AST: 19 U/L (ref 10–35)
Albumin: 4.3 g/dL (ref 3.6–5.1)
Alkaline phosphatase (APISO): 138 U/L (ref 37–153)
BUN: 15 mg/dL (ref 7–25)
CO2: 26 mmol/L (ref 20–32)
Calcium: 9.6 mg/dL (ref 8.6–10.4)
Chloride: 104 mmol/L (ref 98–110)
Creat: 0.65 mg/dL (ref 0.60–1.00)
Globulin: 3.9 g/dL — ABNORMAL HIGH (ref 1.9–3.7)
Glucose, Bld: 91 mg/dL (ref 65–99)
Potassium: 4.1 mmol/L (ref 3.5–5.3)
Sodium: 139 mmol/L (ref 135–146)
Total Bilirubin: 0.4 mg/dL (ref 0.2–1.2)
Total Protein: 8.2 g/dL — ABNORMAL HIGH (ref 6.1–8.1)

## 2024-05-18 LAB — TSH: TSH: 13.3 m[IU]/L — ABNORMAL HIGH (ref 0.40–4.50)

## 2024-05-18 LAB — CBC WITH DIFFERENTIAL/PLATELET
Absolute Lymphocytes: 1343 {cells}/uL (ref 850–3900)
Absolute Monocytes: 304 {cells}/uL (ref 200–950)
Basophils Absolute: 51 {cells}/uL (ref 0–200)
Basophils Relative: 1.1 %
Eosinophils Absolute: 133 {cells}/uL (ref 15–500)
Eosinophils Relative: 2.9 %
HCT: 43.3 % (ref 35.0–45.0)
Hemoglobin: 14 g/dL (ref 11.7–15.5)
MCH: 27.7 pg (ref 27.0–33.0)
MCHC: 32.3 g/dL (ref 32.0–36.0)
MCV: 85.7 fL (ref 80.0–100.0)
MPV: 10.8 fL (ref 7.5–12.5)
Monocytes Relative: 6.6 %
Neutro Abs: 2769 {cells}/uL (ref 1500–7800)
Neutrophils Relative %: 60.2 %
Platelets: 263 Thousand/uL (ref 140–400)
RBC: 5.05 Million/uL (ref 3.80–5.10)
RDW: 13.9 % (ref 11.0–15.0)
Total Lymphocyte: 29.2 %
WBC: 4.6 Thousand/uL (ref 3.8–10.8)

## 2024-05-18 LAB — LIPID PANEL
Cholesterol: 260 mg/dL — ABNORMAL HIGH (ref <200)
HDL: 33 mg/dL — ABNORMAL LOW (ref >=50)
LDL Cholesterol (Calc): 172 mg/dL — ABNORMAL HIGH
Non-HDL Cholesterol (Calc): 227 mg/dL — ABNORMAL HIGH (ref <130)
Total CHOL/HDL Ratio: 7.9 (calc) — ABNORMAL HIGH (ref <5.0)
Triglycerides: 324 mg/dL — ABNORMAL HIGH (ref <150)

## 2024-05-18 LAB — TEST AUTHORIZATION 2

## 2024-05-18 LAB — T4, FREE: Free T4: 0.9 ng/dL (ref 0.8–1.8)

## 2024-05-18 LAB — T3, FREE: T3, Free: 3.4 pg/mL (ref 2.3–4.2)

## 2024-05-18 MED ORDER — LEVOTHYROXINE SODIUM 25 MCG PO TABS: 25.0000 ug | ORAL_TABLET | Freq: Every day | ORAL | 1 refills | Status: AC

## 2024-05-18 MED ORDER — ROSUVASTATIN CALCIUM 10 MG PO TABS: 10.0000 mg | ORAL_TABLET | Freq: Every day | ORAL | 1 refills | Status: DC

## 2024-05-23 ENCOUNTER — Telehealth: Payer: Self-pay

## 2024-05-23 NOTE — Telephone Encounter (Signed)
 Order along with provider note was faxed over to [employer] for incontinence supplies. (Pads & chucks)

## 2024-06-22 ENCOUNTER — Inpatient Hospital Stay

## 2024-06-22 ENCOUNTER — Inpatient Hospital Stay: Admitting: Oncology

## 2024-06-26 ENCOUNTER — Inpatient Hospital Stay: Attending: Hematology and Oncology | Admitting: Hematology and Oncology

## 2024-06-26 ENCOUNTER — Inpatient Hospital Stay

## 2024-06-26 VITALS — BP 120/74 | HR 82 | Temp 98.0°F | Resp 18 | Ht 66.0 in | Wt 162.0 lb

## 2024-06-26 DIAGNOSIS — Z832 Family history of diseases of the blood and blood-forming organs and certain disorders involving the immune mechanism: Secondary | ICD-10-CM | POA: Insufficient documentation

## 2024-06-26 DIAGNOSIS — I10 Essential (primary) hypertension: Secondary | ICD-10-CM | POA: Diagnosis not present

## 2024-06-26 DIAGNOSIS — Z86711 Personal history of pulmonary embolism: Secondary | ICD-10-CM | POA: Insufficient documentation

## 2024-06-26 DIAGNOSIS — Z79899 Other long term (current) drug therapy: Secondary | ICD-10-CM | POA: Insufficient documentation

## 2024-06-26 DIAGNOSIS — Z79890 Hormone replacement therapy (postmenopausal): Secondary | ICD-10-CM | POA: Diagnosis not present

## 2024-06-26 DIAGNOSIS — M1990 Unspecified osteoarthritis, unspecified site: Secondary | ICD-10-CM | POA: Diagnosis not present

## 2024-06-26 DIAGNOSIS — Z7901 Long term (current) use of anticoagulants: Secondary | ICD-10-CM | POA: Insufficient documentation

## 2024-06-26 DIAGNOSIS — I2782 Chronic pulmonary embolism: Secondary | ICD-10-CM

## 2024-06-26 DIAGNOSIS — D6861 Antiphospholipid syndrome: Secondary | ICD-10-CM | POA: Diagnosis not present

## 2024-06-26 DIAGNOSIS — I2690 Septic pulmonary embolism without acute cor pulmonale: Secondary | ICD-10-CM

## 2024-06-26 DIAGNOSIS — D6851 Activated protein C resistance: Secondary | ICD-10-CM | POA: Insufficient documentation

## 2024-06-26 DIAGNOSIS — Z5181 Encounter for therapeutic drug level monitoring: Secondary | ICD-10-CM | POA: Diagnosis not present

## 2024-06-26 DIAGNOSIS — Z807 Family history of other malignant neoplasms of lymphoid, hematopoietic and related tissues: Secondary | ICD-10-CM | POA: Diagnosis not present

## 2024-06-26 DIAGNOSIS — D6859 Other primary thrombophilia: Secondary | ICD-10-CM | POA: Insufficient documentation

## 2024-06-26 DIAGNOSIS — I8393 Asymptomatic varicose veins of bilateral lower extremities: Secondary | ICD-10-CM | POA: Insufficient documentation

## 2024-06-26 NOTE — Progress Notes (Signed)
 [facility] CONSULT NOTE  Patient Care Team: Ngetich, Roxan BROCKS, NP as PCP - General (Family Medicine)  CHIEF COMPLAINTS/PURPOSE OF CONSULTATION:  Recurrent pulmonary embolism  HISTORY OF PRESENTING ILLNESS: Consultation to evaluate the cause of recurrent pulmonary emboli  History of Present Illness Angela Becker is a 73 year old female with a history of pulmonary embolism who presents for evaluation of recurrent blood clots. She is accompanied by her daughter and an interpreter. She was referred by another doctor for evaluation of recurrent blood clots.  She experienced her first pulmonary embolism in 2019 after a trip to [place]  and was treated with blood thinners, though the duration is unclear. Her daughter recalls a switch to aspirin . In February, she had another pulmonary embolism without recent travel or significant activity changes. She has been on blood thinners for six to seven months.  During the 2019 episode, she had heavy breathing and significant leg and foot swelling. The recent episode had similar symptoms, but with less severe breathing difficulty. Her daughter notes decreased activity levels.  There is a family history of blood clots; a brother reportedly died from a blood clot, and a sister had multiple myeloma. She has varicose veins.    I reviewed her records extensively and collaborated the history with the patient.   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  SOCIAL HISTORY: Social History   Socioeconomic History   Marital status: Single    Spouse name: Not on file   Number of children:  Not on file   Years of education: Not on file   Highest education level: Not on file  Occupational History   Not on file  Tobacco Use   Smoking status: Never   Smokeless tobacco: Never  Vaping Use   Vaping status: Never Used  Substance and Sexual Activity   Alcohol use: Not Currently   Drug use: Never   Sexual activity: Not on file  Other Topics Concern   Not on file  Social History Narrative   2 children    Married but separated for years    Lives with her daughter   Deaf   Blind in right eye   Graduated high school   Can read but difficulty understanding   Only worked a short time.    Social Drivers of Corporate investment banker Strain: Not on file  Food Insecurity: No Food Insecurity (06/26/2024)   Hunger Vital Sign    Worried About Running Out of Food in the Last Year: Never true    Ran Out of Food in the Last Year: Never true  Transportation Needs: No Transportation Needs (06/26/2024)   PRAPARE - Administrator, Civil Service (Medical): No    Lack of Transportation (Non-Medical): No  Physical Activity: Not on file  Stress: Not on file  Social Connections: Patient Declined (12/14/2023)   Social Connection and Isolation Panel    Frequency of Communication with Friends and Family: Patient declined    Frequency of Social Gatherings with Friends and Family: Patient declined    Attends Religious Services:  Patient declined    Active Member of Clubs or Organizations: Patient declined    Attends Banker Meetings: Patient declined    Marital Status: Patient declined  Intimate Partner Violence: Not At Risk (06/26/2024)   Humiliation, Afraid, Rape, and Kick questionnaire    Fear of Current or Ex-Partner: No    Emotionally Abused: No    Physically Abused: No    Sexually Abused: No    FAMILY HISTORY: Family History  Problem Relation Age of Onset   CAD Mother    Hypertension Mother    Clotting disorder Sister    Multiple myeloma Sister    Diabetes  Sister    CAD Sister    Hypertension Sister    Diabetes Other     ALLERGIES:  is allergic to penicillins.  MEDICATIONS:  Current Outpatient Medications  Medication Sig Dispense Refill   acetaminophen  (TYLENOL ) 500 MG tablet Take 1,000 mg by mouth 2 (two) times daily.     amLODipine  (NORVASC ) 10 MG tablet Take 1 tablet (10 mg total) by mouth at bedtime. 90 tablet 1   apixaban  (ELIQUIS ) 5 MG TABS tablet Take 1 tablet (5 mg total) by mouth 2 (two) times daily. 180 tablet 1   ascorbic acid (VITAMIN C) 500 MG tablet Take 500 mg by mouth daily.     haloperidol  (HALDOL ) 2 MG tablet Take 2 mg by mouth at bedtime.     levothyroxine  (SYNTHROID ) 25 MCG tablet Take 1 tablet (25 mcg total) by mouth daily before breakfast. on empty stomach 2 hours away from other medications. 90 tablet 1   losartan  (COZAAR ) 50 MG tablet Take 1 tablet (50 mg total) by mouth at bedtime. 90 tablet 1   Menthol, Topical Analgesic, (BIOFREEZE EX) Apply 1 Application topically 3 (three) times daily as needed.     phenazopyridine  (PYRIDIUM ) 100 MG tablet Take 1 tablet (100 mg total) by mouth 3 (three) times daily as needed (bladder spasm). 10 tablet 0   rosuvastatin  (CRESTOR ) 10 MG tablet Take 1 tablet (10 mg total) by mouth daily. 90 tablet 1   No current facility-administered medications for this visit.    REVIEW OF SYSTEMS:   Constitutional: Denies fevers, chills or abnormal night sweats All other systems were reviewed with the patient and are negative.  PHYSICAL EXAMINATION: ECOG PERFORMANCE STATUS: 1 - Symptomatic but completely ambulatory  Vitals:   06/26/24 1549  BP: 120/74  Pulse: 82  Resp: 18  Temp: 98 F (36.7 C)  SpO2: 96%   Filed Weights   06/26/24 1549  Weight: 162 lb (73.5 kg)    GENERAL:alert, no distress and comfortable  LABORATORY DATA:  I have reviewed the data as listed Lab Results  Component Value Date   WBC 4.6 05/16/2024   HGB 14.0 05/16/2024   HCT 43.3 05/16/2024   MCV 85.7  05/16/2024   PLT 263 05/16/2024   Lab Results  Component Value Date   NA 139 05/16/2024   K 4.1 05/16/2024   CL 104 05/16/2024   CO2 26 05/16/2024    RADIOGRAPHIC STUDIES: I have personally reviewed the radiological reports and agreed with the findings in the report.  ASSESSMENT AND PLAN:  Pulmonary embolism (HCC) 06/21/2018: CT angiogram: Saddle PE 12/13/2023: CT angiogram: Moderate to large volume acute saddle pulmonary embolism (hospitalization 12/13/2023-12/15/2023)  Current treatment: Eliquis   Chronic/recurrent blood clots I discussed with the patient risk factors for blood clots.  Inherited risk factors include: 1. Factor V Leiden mutation 2. Prothrombin gene G20210A  3. Protein S deficiency  4. Protein C deficiency  5. Antithrombin deficiency  Acquired risk factors include: 1. Antiphospholipid antibody syndrome Patient does not have any clearly identifiable predisposing factor for the current blood clot. It is for this reason I recommend indefinite anticoagulation.  The purpose of doing the blood work today is to identify if there are any inheritable risk factors that would explain her blood clot and if any of her family members need to take precautions.  Workup recommended: Bloodwork to evaluate for the 5 inherited factors mentioned above along with antiphospholipid antibodies. Return to clinic in one to 2 weeks to discuss the results of the tests Because she requires sign language, we will do a video visit with the sign language interpreter.   Assessment & Plan Recurrent pulmonary embolism Recurrent episodes in 2019 and February 2025, with initial episode linked to travel and immobility. Second episode had no clear cause. Differential includes inherited/acquired thrombophilia. Family history of clots noted. Imaging ruled out malignancy. Lifelong anticoagulation recommended. - Order blood tests for thrombophilia, including antiphospholipid syndrome. - Continue  lifelong anticoagulation therapy. - Explained blood tests are informational and will not change anticoagulation decision.  Therapeutic anticoagulation management On anticoagulation for recurrent pulmonary embolism. Lifelong therapy recommended due to recurrence and clot burden. Risks of stopping outweigh benefits, even with varicose veins. - Continue lifelong anticoagulation therapy. - Review blood test results for etiology information, not to change management.  Varicose veins of lower extremities Varicose veins may contribute to venous stasis and clot formation, but definitive attribution to pulmonary embolism is challenging. - Refer to vein specialist for evaluation of varicose veins and potential thromboembolic contribution.    All questions were answered. The patient knows to call the clinic with any problems, questions or concerns.    Viinay K Akira Perusse, MD 06/26/24

## 2024-06-26 NOTE — Assessment & Plan Note (Signed)
 06/21/2018: CT angiogram: Saddle PE 12/13/2023: CT angiogram: Moderate to large volume acute saddle pulmonary embolism (hospitalization 12/13/2023-12/15/2023)  Current treatment: Eliquis   Chronic/recurrent blood clots I discussed with the patient risk factors for blood clots.  Inherited risk factors include: 1. Factor V Leiden mutation 2. Prothrombin gene G20210A 3. Protein S deficiency  4. Protein C deficiency  5. Antithrombin deficiency  Acquired risk factors include: 1. Antiphospholipid antibody syndrome 2. Tobacco use 3. Obesity 4. Medications including oral contraceptives 5. Sedentary behavior including postoperative state 6. Foreign bodies in circulation  Workup recommended: Bloodwork to evaluate for the 5 inherited factors mentioned above along with antiphospholipid antibodies. Return to clinic in one to 2 weeks to discuss the results of the tests  Based upon her history, she is likely to remain on anticoagulation for life.

## 2024-06-27 ENCOUNTER — Inpatient Hospital Stay

## 2024-06-27 LAB — ANTITHROMBIN III: AntiThromb III Func: 97 % (ref 75–120)

## 2024-06-28 ENCOUNTER — Telehealth (INDEPENDENT_AMBULATORY_CARE_PROVIDER_SITE_OTHER): Admitting: Family

## 2024-06-28 ENCOUNTER — Encounter: Payer: Self-pay | Admitting: Family

## 2024-06-28 DIAGNOSIS — R300 Dysuria: Secondary | ICD-10-CM

## 2024-06-28 LAB — POCT URINALYSIS DIPSTICK
Blood, UA: NEGATIVE
Glucose, UA: NEGATIVE
Ketones, UA: NEGATIVE
Nitrite, UA: POSITIVE
Protein, UA: POSITIVE — AB
Spec Grav, UA: 1.02 (ref 1.010–1.025)
Urobilinogen, UA: 0.2 U/dL
pH, UA: 5 (ref 5.0–8.0)

## 2024-06-28 LAB — LUPUS ANTICOAGULANT PANEL
DRVVT: 43.7 s (ref 0.0–47.0)
PTT Lupus Anticoagulant: 34.2 s (ref 0.0–43.5)

## 2024-06-28 LAB — PROTEIN S, TOTAL: Protein S Ag, Total: 127 % (ref 60–150)

## 2024-06-28 MED ORDER — PHENAZOPYRIDINE HCL 95 MG PO TABS: 95.0000 mg | ORAL_TABLET | Freq: Three times a day (TID) | ORAL | 0 refills | Status: AC | PRN

## 2024-06-28 NOTE — Progress Notes (Unsigned)
   This service is provided via telemedicine  No vital signs collected/recorded due to the encounter was a telemedicine visit.   Location of patient (ex: home, work):  Home  Patient consents to a telephone visit: Yes  Location of the provider (ex: office, home):  [facility], Office   Name of any referring provider:  N/A  Names of all persons participating in the telemedicine service and their role in the encounter:  Shelli Ferrier, CMA, Patient, and Pt daughter Naomie and Ngetich, Roxan BROCKS, NP   Time spent on call:  9 min with medical assistant

## 2024-06-28 NOTE — Progress Notes (Unsigned)
 Provider: Roxan Plough FNP-C  Nickalaus Crooke, Roxan BROCKS, NP  Patient Care Team: Kmari Halter, Roxan BROCKS, NP as PCP - General (Family Medicine)  Extended Emergency Contact Information Primary Emergency Contact: Gray,[name]  [place] Home Phone: [phone] Relation: Daughter Secondary Emergency Contact: Boyce,Sirvantus  [place] Home Phone: [phone] Relation: Son  Code Status:  Full Code  Goals of care: Advanced Directive information    06/28/2024    2:09 PM  Advanced Directives  Does Patient Have a Medical Advance Directive? Yes  Type of Advance Directive Healthcare Power of Attorney  Does patient want to make changes to medical advance directive? No - Patient declined  Copy of Healthcare Power of Attorney in Chart? No - copy requested     Chief Complaint  Patient presents with   Urinary Frequency    Pt c/o burning when urinating.    Discussed the use of AI scribe software for clinical note transcription with the patient, who gave verbal consent to proceed.  History of Present Illness   Angela Becker is a 73 year old female who presents with urinary symptoms including a burning sensation during urination. Also has urine frequency. Sign language assist with patient's daughter Angela Becker.  She experiences a burning sensation during urination without nausea or vomiting. No back pain is reported. Also denies any fever or chills   She has a known allergy to penicillin. Current medications were not specified.    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 No pertinent surgical history.  Allergies  Allergen Reactions   Penicillins      Outpatient Encounter Medications as of 06/28/2024  Medication Sig   acetaminophen  (TYLENOL ) 500 MG tablet Take 1,000 mg by mouth 2 (two) times daily.   amLODipine  (NORVASC ) 10 MG tablet Take 1 tablet (10 mg total) by mouth at bedtime.   apixaban  (ELIQUIS ) 5 MG TABS tablet Take 1 tablet (5 mg total) by mouth 2 (two) times daily.   ascorbic acid (VITAMIN C) 500 MG tablet Take 500 mg by mouth daily.   haloperidol  (HALDOL ) 2 MG tablet Take 2 mg by mouth at bedtime.   levothyroxine  (SYNTHROID ) 25 MCG tablet Take 1 tablet (25 mcg total) by mouth daily before breakfast. on empty stomach 2 hours away from other medications.   losartan  (COZAAR ) 50 MG tablet Take 1 tablet (50 mg total) by mouth at bedtime.   Menthol, Topical Analgesic, (BIOFREEZE EX) Apply 1 Application topically 3 (three) times daily as needed.   phenazopyridine  (AZO-TABS) 95 MG tablet Take 1 tablet (95 mg total) by mouth 3 (three) times daily as needed for pain.   rosuvastatin  (CRESTOR ) 10 MG tablet Take 1 tablet (10 mg total) by mouth daily.   [DISCONTINUED] phenazopyridine  (PYRIDIUM ) 100 MG tablet Take 1 tablet (100 mg total) by mouth 3 (three) times daily as needed (bladder spasm). (Patient not taking: Reported on 06/28/2024)   No facility-administered encounter medications on file as of 06/28/2024.    Review of Systems  Constitutional:  Negative for appetite change, chills, fatigue, fever and unexpected weight change.  Eyes:  Negative for pain, discharge, redness and itching.  Respiratory:  Negative for  cough, chest tightness, shortness of breath and wheezing.   Cardiovascular:  Negative for chest pain, palpitations and leg swelling.  Gastrointestinal:  Negative for abdominal distention, abdominal pain, constipation, diarrhea, nausea and vomiting.  Genitourinary:  Positive for dysuria and frequency. Negative for difficulty urinating, flank pain and urgency.  Musculoskeletal:  Negative for arthralgias, back pain, gait problem  and myalgias.  Neurological:  Negative for dizziness, weakness, light-headedness and headaches.    Immunization History  Administered Date(s) Administered   Fluzone Influenza virus vaccine,trivalent (IIV3), split virus 08/31/2004, 09/13/2005   Influenza-Unspecified 08/17/2011, 10/13/2012, 08/12/2022   Moderna Covid-19 Vaccine Bivalent Booster 20yrs & up 12/25/2019, 01/22/2020, 10/15/2020   Pneumococcal-Unspecified 06/12/2022   Zoster, Unspecified 06/12/2022, 08/12/2022   Pertinent  Health Maintenance Due  Topic Date Due   Colonoscopy  Never done   DEXA SCAN  Never done   Influenza Vaccine  05/18/2024   Mammogram  10/28/2024      05/02/2024   12:57 PM  Fall Risk  Falls in the past year? 0  Was there an injury with Fall? 0  Fall Risk Category Calculator 0  Patient at Risk for Falls Due to No Fall Risks  Fall risk Follow up Falls evaluation completed   Functional Status Survey:    There were no vitals filed for this visit. There is no height or weight on file to calculate BMI. Physical Exam Constitutional:      General: She is not in acute distress.    Appearance: She is not ill-appearing.  HENT:     Ears:     Comments: Non-verbal,deaf communicates with sign language  Pulmonary:     Effort: Pulmonary effort is normal. No respiratory distress.  Neurological:     Mental Status: She is alert and oriented to person, place, and time.     Gait: Gait normal.  Psychiatric:        Mood and Affect: Mood normal.        Behavior: Behavior normal.     Labs reviewed: Recent Labs    12/14/23 0449 12/15/23 0540 05/16/24 1520  NA 137 133* 139  K 3.3* 3.7 4.1  CL 103 101 104  CO2 24 23 26   GLUCOSE 114* 124* 91  BUN 16 14 15   CREATININE 0.60 0.72 0.65  CALCIUM  8.6* 8.6* 9.6   Recent Labs    12/13/23 1406 12/14/23 0449 05/16/24 1520  AST 30 17 19   ALT 19 13 15   ALKPHOS 169* 126  --   BILITOT 1.1 0.8 0.4  PROT 9.3* 7.6 8.2*  ALBUMIN 4.1 3.4*  --    Recent Labs     12/14/23 0449 12/15/23 0540 05/16/24 1520  WBC 8.1 9.2 4.6  NEUTROABS  --   --  2,769  HGB 12.2 13.0 14.0  HCT 38.4 42.5 43.3  MCV 85.9 88.9 85.7  PLT 228 268 263   Lab Results  Component Value Date   TSH 13.30 (H) 05/16/2024   No results found for: HGBA1C Lab Results  Component Value Date   CHOL 260 (H) 05/16/2024   HDL 33 (L) 05/16/2024   LDLCALC 172 (H) 05/16/2024   TRIG 324 (H) 05/16/2024   CHOLHDL 7.9 (H) 05/16/2024    Significant Diagnostic Results in last 30 days:  No results found.  Assessment/Plan  Dysuria  Suspected urinary tract infection with dysuria and increased frequency. No nausea, vomiting, or back pain reported. - Obtain urine specimen for culture - Recommend ciprofloxacin after urine specimen is provided, considering penicillin  allergy. Made aware might need to switch antibiotics if cultures indicate a different antibiotics Daughter prefers to wait for final urine culture.  - Recommend Azo tablets to acidify urine and alleviate bladder spasms - Advise increased water intake to aid in flushing the urinary system   Family/ staff Communication: Reviewed plan of care with patient verbalized understanding via sign language assisted by Daughter Angela Becker.   Labs/tests ordered:  - Urine Culture; Future - POC Urinalysis Dipstick   Next Appointment: Return if symptoms worsen or fail to improve.   Total time: 20 minutes. Greater than 50% of total time spent doing patient education regarding dysuria ,health maintenance including symptom/medication management.  I connected with  Winton Kitty on 06/28/24 by a video enabled telemedicine application and verified that I am speaking with the correct person using two identifiers.   I discussed the limitations of evaluation and management by telemedicine. The patient expressed understanding and agreed to proceed.  Roxan JAYSON Plough, NP

## 2024-06-29 LAB — BETA-2-GLYCOPROTEIN I ABS, IGG/M/A
Beta-2 Glyco I IgG: <9 GPI IgG units (ref 0–20)
Beta-2-Glycoprotein I IgA: <9 GPI IgA units (ref 0–25)
Beta-2-Glycoprotein I IgM: <9 GPI IgM units (ref 0–32)

## 2024-06-29 LAB — URINE CULTURE
MICRO NUMBER:: 16956363
Result:: NO GROWTH
SPECIMEN QUALITY:: ADEQUATE

## 2024-06-29 LAB — CARDIOLIPIN ANTIBODIES, IGG, IGM, IGA
Anticardiolipin IgA: 19 U/mL — ABNORMAL HIGH (ref 0–11)
Anticardiolipin IgG: <9 GPL U/mL (ref 0–14)
Anticardiolipin IgM: <9 [MPL'U]/mL (ref 0–12)

## 2024-06-29 LAB — PROTEIN C, TOTAL: Protein C, Total: 98 % (ref 60–150)

## 2024-07-02 ENCOUNTER — Ambulatory Visit: Payer: Self-pay | Admitting: Family

## 2024-07-02 LAB — PROTHROMBIN GENE MUTATION

## 2024-07-02 LAB — FACTOR 5 LEIDEN

## 2024-07-10 ENCOUNTER — Inpatient Hospital Stay (HOSPITAL_BASED_OUTPATIENT_CLINIC_OR_DEPARTMENT_OTHER): Admitting: Hematology and Oncology

## 2024-07-10 DIAGNOSIS — Z86711 Personal history of pulmonary embolism: Secondary | ICD-10-CM | POA: Diagnosis not present

## 2024-07-10 DIAGNOSIS — I2699 Other pulmonary embolism without acute cor pulmonale: Secondary | ICD-10-CM | POA: Diagnosis not present

## 2024-07-10 NOTE — Progress Notes (Signed)
 MyChart virtual visit  Patient Care Team: Ngetich, Dinah C, NP as PCP - General (Family Medicine)  DIAGNOSIS:  Encounter Diagnosis  Name Primary?   Acute pulmonary embolism, unspecified pulmonary embolism type, unspecified whether acute cor pulmonale present (HCC) Yes    CHIEF COMPLIANT: Follow-up to discuss results of blood work History of Present Illness [name] is a 73 year old who has a prior history of pulmonary emboli who underwent extensive blood work and connected with me by video to discuss results.  We attempted to do the discussion with our video interpreter but it did not work out.  Her family interpreted for us .   ALLERGIES:  is allergic to penicillins.  MEDICATIONS:  Current Outpatient Medications  Medication Sig Dispense Refill   acetaminophen  (TYLENOL ) 500 MG tablet Take 1,000 mg by mouth 2 (two) times daily.     amLODipine  (NORVASC ) 10 MG tablet Take 1 tablet (10 mg total) by mouth at bedtime. 90 tablet 1   apixaban  (ELIQUIS ) 5 MG TABS tablet Take 1 tablet (5 mg total) by mouth 2 (two) times daily. 180 tablet 1   ascorbic acid (VITAMIN C) 500 MG tablet Take 500 mg by mouth daily.     haloperidol  (HALDOL ) 2 MG tablet Take 2 mg by mouth at bedtime.     levothyroxine  (SYNTHROID ) 25 MCG tablet Take 1 tablet (25 mcg total) by mouth daily before breakfast. on empty stomach 2 hours away from other medications. 90 tablet 1   losartan  (COZAAR ) 50 MG tablet Take 1 tablet (50 mg total) by mouth at bedtime. 90 tablet 1   Menthol, Topical Analgesic, (BIOFREEZE EX) Apply 1 Application topically 3 (three) times daily as needed.     phenazopyridine  (AZO-TABS) 95 MG tablet Take 1 tablet (95 mg total) by mouth 3 (three) times daily as needed for pain. 10 tablet 0   rosuvastatin  (CRESTOR ) 10 MG tablet Take 1 tablet (10 mg total) by mouth daily. 90 tablet 1   No current facility-administered medications for this visit.    PHYSICAL EXAMINATION: ECOG PERFORMANCE STATUS: 1 -  Symptomatic but completely ambulatory    LABORATORY DATA:  I have reviewed the data as listed    Latest Ref Rng & Units 05/16/2024    3:20 PM 12/15/2023    5:40 AM 12/14/2023    4:49 AM  CMP  Glucose 65 - 99 mg/dL 91  875  885   BUN 7 - 25 mg/dL 15  14  16    Creatinine 0.60 - 1.00 mg/dL 9.34  9.27  9.39   Sodium 135 - 146 mmol/L 139  133  137   Potassium 3.5 - 5.3 mmol/L 4.1  3.7  3.3   Chloride 98 - 110 mmol/L 104  101  103   CO2 20 - 32 mmol/L 26  23  24    Calcium  8.6 - 10.4 mg/dL 9.6  8.6  8.6   Total Protein 6.1 - 8.1 g/dL 8.2   7.6   Total Bilirubin 0.2 - 1.2 mg/dL 0.4   0.8   Alkaline Phos 38 - 126 U/L   126   AST 10 - 35 U/L 19   17   ALT 6 - 29 U/L 15   13     Lab Results  Component Value Date   WBC 4.6 05/16/2024   HGB 14.0 05/16/2024   HCT 43.3 05/16/2024   MCV 85.7 05/16/2024   PLT 263 05/16/2024   NEUTROABS 2,769 05/16/2024    ASSESSMENT & PLAN:  Pulmonary embolus (HCC) 06/21/2018: CT angiogram: Saddle PE 12/13/2023: CT angiogram: Moderate to large volume acute saddle pulmonary embolism (hospitalization 12/13/2023-12/15/2023)   Current treatment: Eliquis  Hypercoagulability workup: 06/27/2024: No lupus anticoagulant, protein S 127%, protein C 98%, anticardiolipin antibodies: IgG A19 (indeterminate) beta-2  glycoprotein 1 antibodies: Negative, Antithrombin III  97%, factor V Leiden: Not detected prothrombin gene mutation: Not detected  Recommendation: Lifelong anticoagulation Return to clinic on an as-needed basis   No orders of the defined types were placed in this encounter.  The patient has a good understanding of the overall plan. she agrees with it. she will call with any problems that may develop before the next visit here. Total time spent: 30 mins including face to face time and time spent for planning, charting and co-ordination of care   Angela MARLA Chad, MD 07/10/24

## 2024-07-10 NOTE — Assessment & Plan Note (Signed)
 06/21/2018: CT angiogram: Saddle PE 12/13/2023: CT angiogram: Moderate to large volume acute saddle pulmonary embolism (hospitalization 12/13/2023-12/15/2023)   Current treatment: Eliquis  Hypercoagulability workup: 06/27/2024: No lupus anticoagulant, protein S 127%, protein C 98%, anticardiolipin antibodies: IgG A19 (indeterminate) beta-2  glycoprotein 1 antibodies: Negative, Antithrombin III  97%, factor V Leiden: Not detected prothrombin gene mutation: Not detected  Recommendation: Lifelong anticoagulation Return to clinic on an as-needed basis

## 2024-07-12 ENCOUNTER — Other Ambulatory Visit

## 2024-07-12 DIAGNOSIS — T50905A Adverse effect of unspecified drugs, medicaments and biological substances, initial encounter: Secondary | ICD-10-CM

## 2024-07-12 DIAGNOSIS — E039 Hypothyroidism, unspecified: Secondary | ICD-10-CM

## 2024-07-12 DIAGNOSIS — E7800 Pure hypercholesterolemia, unspecified: Secondary | ICD-10-CM

## 2024-07-13 ENCOUNTER — Ambulatory Visit: Payer: Self-pay | Admitting: Family

## 2024-07-13 DIAGNOSIS — E039 Hypothyroidism, unspecified: Secondary | ICD-10-CM

## 2024-07-13 LAB — HEPATIC FUNCTION PANEL
AG Ratio: 1.1 (calc) (ref 1.0–2.5)
ALT: 11 U/L (ref 6–29)
AST: 14 U/L (ref 10–35)
Albumin: 4.1 g/dL (ref 3.6–5.1)
Alkaline phosphatase (APISO): 123 U/L (ref 37–153)
Bilirubin, Direct: 0.1 mg/dL (ref 0.0–0.2)
Globulin: 3.6 g/dL (ref 1.9–3.7)
Indirect Bilirubin: 0.3 mg/dL (ref 0.2–1.2)
Total Bilirubin: 0.4 mg/dL (ref 0.2–1.2)
Total Protein: 7.7 g/dL (ref 6.1–8.1)

## 2024-07-13 LAB — TSH: TSH: 8.62 m[IU]/L — ABNORMAL HIGH (ref 0.40–4.50)

## 2024-07-16 NOTE — Telephone Encounter (Signed)
 Gray,[name] (Emergency Contact) to Zeidan, Vivian DG    07/16/24  2:42 PM daughter [name] calling for lab results. I relayed notes, she expressed understanding and had no further questions.

## 2024-08-03 ENCOUNTER — Other Ambulatory Visit: Payer: Self-pay

## 2024-08-03 DIAGNOSIS — I8393 Asymptomatic varicose veins of bilateral lower extremities: Secondary | ICD-10-CM

## 2024-08-28 ENCOUNTER — Ambulatory Visit (INDEPENDENT_AMBULATORY_CARE_PROVIDER_SITE_OTHER): Admitting: Vascular Surgery

## 2024-08-28 ENCOUNTER — Ambulatory Visit (HOSPITAL_COMMUNITY)
Admission: RE | Admit: 2024-08-28 | Discharge: 2024-08-28 | Disposition: A | Discharge status: Home / Self Care | Source: Ambulatory Visit | Attending: Vascular Surgery | Admitting: Vascular Surgery

## 2024-08-28 ENCOUNTER — Encounter: Payer: Self-pay | Admitting: Vascular Surgery

## 2024-08-28 VITALS — BP 120/75 | HR 88 | Temp 98.0°F | Resp 20 | Ht 66.0 in | Wt 166.0 lb

## 2024-08-28 DIAGNOSIS — I8393 Asymptomatic varicose veins of bilateral lower extremities: Secondary | ICD-10-CM | POA: Insufficient documentation

## 2024-08-28 DIAGNOSIS — I872 Venous insufficiency (chronic) (peripheral): Secondary | ICD-10-CM | POA: Insufficient documentation

## 2024-08-28 NOTE — Progress Notes (Signed)
 Patient name: Angela Becker MRN: [id number] DOB: [birth year] Sex: female  REASON FOR CONSULT: Varicose veins of lower extremity  HPI: Angela Becker is a 73 y.o. female, with a right eye blindness as well as PE in 2019 and 2025 on Eliquis  that presents for evaluation of varicose veins in her lower extremities.  Patient is here with her daughter who provides a lot of the history.  Additional history is obtained through the interpreter for sign language.  Describes swelling in her right leg and aching and pain in the calf that is intermittent.  No prior DVTs in the legs.  Has been treated for multiple PEs according to the daughter and is on lifelong Eliquis  at this time.  Has a hard time wearing compression stockings.  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  Family History  Problem Relation Age of Onset   CAD Mother    Hypertension Mother    Clotting disorder Sister    Multiple myeloma Sister    Diabetes Sister    CAD Sister    Hypertension Sister    Diabetes Other     SOCIAL HISTORY: Social History   Socioeconomic History   Marital status: Single    Spouse name: Not on file   Number of children: Not on file   Years of education: Not on file   Highest education level: Not on file  Occupational History   Not on file  Tobacco Use   Smoking status: Never   Smokeless tobacco: Never  Vaping Use   Vaping status: Never Used  Substance and Sexual Activity   Alcohol use: Not Currently   Drug use: Never   Sexual activity: Not on file  Other Topics Concern   Not on file  Social History Narrative   2 children    Married but separated for years    Lives with  her daughter   Deaf   Blind in right eye   Graduated high school   Can read but difficulty understanding   Only worked a short time.    Social Drivers of Corporate Investment Banker Strain: Not on file  Food Insecurity: No Food Insecurity (06/26/2024)   Hunger Vital Sign    Worried About Running Out of Food in the Last Year: Never true    Ran Out of Food in the Last Year: Never true  Transportation Needs: No Transportation Needs (06/26/2024)   PRAPARE - Administrator, Civil Service (Medical): No    Lack of Transportation (Non-Medical): No  Physical Activity: Not on file  Stress: Not on file  Social Connections: Patient Declined (12/14/2023)   Social Connection and Isolation Panel    Frequency of Communication with Friends and Family: Patient declined    Frequency of Social Gatherings with Friends and Family: Patient declined    Attends Religious Services: Patient declined    Database Administrator or Organizations: Patient declined    Attends Banker Meetings: Patient declined    Marital Status: Patient declined  Intimate Partner Violence: Not At Risk (06/26/2024)   Humiliation, Afraid, Rape, and  Kick questionnaire    Fear of Current or Ex-Partner: No    Emotionally Abused: No    Physically Abused: No    Sexually Abused: No    Allergies  Allergen Reactions   Penicillins     Current Outpatient Medications  Medication Sig Dispense Refill   acetaminophen  (TYLENOL ) 500 MG tablet Take 1,000 mg by mouth 2 (two) times daily.     amLODipine  (NORVASC ) 10 MG tablet Take 1 tablet (10 mg total) by mouth at bedtime. 90 tablet 1   apixaban  (ELIQUIS ) 5 MG TABS tablet Take 1 tablet (5 mg total) by mouth 2 (two) times daily. 180 tablet 1   ascorbic acid (VITAMIN C) 500 MG tablet Take 500 mg by mouth daily.     haloperidol  (HALDOL ) 2 MG tablet Take 2 mg by mouth at bedtime.     levothyroxine  (SYNTHROID ) 25 MCG tablet Take 1 tablet (25 mcg total) by mouth daily before  breakfast. on empty stomach 2 hours away from other medications. 90 tablet 1   losartan  (COZAAR ) 50 MG tablet Take 1 tablet (50 mg total) by mouth at bedtime. 90 tablet 1   Menthol, Topical Analgesic, (BIOFREEZE EX) Apply 1 Application topically 3 (three) times daily as needed.     phenazopyridine  (AZO-TABS) 95 MG tablet Take 1 tablet (95 mg total) by mouth 3 (three) times daily as needed for pain. 10 tablet 0   rosuvastatin  (CRESTOR ) 10 MG tablet Take 1 tablet (10 mg total) by mouth daily. 90 tablet 1   No current facility-administered medications for this visit.    REVIEW OF SYSTEMS:  [X]  denotes positive finding, [ ]  denotes negative finding Cardiac  Comments:  Chest pain or chest pressure:    Shortness of breath upon exertion:    Short of breath when lying flat:    Irregular heart rhythm:        Vascular    Pain in calf, thigh, or hip brought on by ambulation:    Pain in feet at night that wakes you up from your sleep:     Blood clot in your veins:    Leg swelling:  x       Pulmonary    Oxygen at home:    Productive cough:     Wheezing:         Neurologic    Sudden weakness in arms or legs:     Sudden numbness in arms or legs:     Sudden onset of difficulty speaking or slurred speech:    Temporary loss of vision in one eye:     Problems with dizziness:         Gastrointestinal    Blood in stool:     Vomited blood:         Genitourinary    Burning when urinating:     Blood in urine:        Psychiatric    Major depression:         Hematologic    Bleeding problems:    Problems with blood clotting too easily:        Skin    Rashes or ulcers:        Constitutional    Fever or chills:      PHYSICAL EXAM: There were no vitals filed for this visit.  GENERAL: The patient is a well-nourished female, in no acute distress. The vital signs are documented above. CARDIAC: There is a regular rate and rhythm.  VASCULAR:  Bilateral femoral pulses palpable Bilateral  DP pulses palpable Notable varicose veins right lower extremity including the calf with no skin changes or venous ulcer PULMONARY: No respiratory distress ABDOMEN: Soft and non-tender. MUSCULOSKELETAL: There are no major deformities or cyanosis. NEUROLOGIC: No focal weakness or paresthesias are detected. SKIN: There are no ulcers or rashes noted. PSYCHIATRIC: The patient has a normal affect.  DATA:    Lower Venous Reflux Study   Patient Name:  Angela Becker  Date of Exam:   08/28/2024  Medical Rec #: [id number]     Accession #:    [id number]  Date of Birth: 02/11/1951     Patient Gender: F  Patient Age:   88 years  Exam Location:  [address]  Procedure:      VAS US  LOWER EXTREMITY VENOUS REFLUX  Referring Phys: LONNI GASKINS    ---------------------------------------------------------------------------  -----    Indications: Varicosities. History of PE.    Anticoagulation: Eliquis .  Performing Technologist: Edsel Mustard RVT     Examination Guidelines: A complete evaluation includes B-mode imaging,  spectral  Doppler, color Doppler, and power Doppler as needed of all accessible  portions  of each vessel. Bilateral testing is considered an integral part of a  complete  examination. Limited examinations for reoccurring indications may be  performed  as noted. The reflux portion of the exam is performed with the patient in  reverse Trendelenburg.  Significant venous reflux is defined as >500 ms in the superficial venous  system, and >1 second in the deep venous system.     Venous Reflux Times  +--------------+---------+------+-----------+------------+--------+  RIGHT        Reflux NoRefluxReflux TimeDiameter cmsComments                          Yes                                   +--------------+---------+------+-----------+------------+--------+  CFV          no                                               +--------------+---------+------+-----------+------------+--------+  Popliteal    no                                              +--------------+---------+------+-----------+------------+--------+  GSV at SFJ              yes    >500 ms      0.92              +--------------+---------+------+-----------+------------+--------+  GSV prox thigh          yes    >500 ms      0.70              +--------------+---------+------+-----------+------------+--------+  GSV mid thigh           yes    >500 ms      0.54              +--------------+---------+------+-----------+------------+--------+  GSV dist thigh          yes    >500 ms  0.61              +--------------+---------+------+-----------+------------+--------+  GSV at knee             yes    >500 ms      0.74              +--------------+---------+------+-----------+------------+--------+  GSV prox calf           yes    >500 ms      0.80              +--------------+---------+------+-----------+------------+--------+  GSV mid calf            yes    >500 ms      0.51              +--------------+---------+------+-----------+------------+--------+  GSV dist calf no                            0.33              +--------------+---------+------+-----------+------------+--------+  SSV at [facility]    no                            0.32              +--------------+---------+------+-----------+------------+--------+         Summary:  Right:  - No evidence of deep vein thrombosis seen in the right lower extremity,  from the common femoral through the popliteal veins.  - No evidence of superficial venous thrombosis in the right lower  extremity.  - Venous reflux is noted in the right sapheno-femoral junction.  - Venous reflux is noted in the right greater saphenous vein in the thigh.  - Venous reflux is noted in the right greater saphenous vein in the calf.    *See table(s)  above for measurements and observations.   Electronically signed by Lonni Gaskins MD on 08/28/2024 at 3:45:43 PM.        Final     Assessment/Plan:  73 y.o. female, with a right eye blindness as well as PE in 2019 and 2025 on Eliquis  that presents for evaluation of varicose veins in her lower extremities.  Discussed the her exam is consistent with chronic venous insufficiency due to valvular reflux in the veins.  I discussed the pathology of chronic venous insufficiency.  I discussed we recommend conservative measures initially with leg elevation, exercise, maintaining healthy weight and compression stockings.  She does have hard time wearing compression stockings so we sized her for knee-high medical grade stockings today 15 - 20 mm Hg and I discussed she wear these daily but does not have to sleep in them.  She potentially would be a candidate for right great saphenous vein ablation based on her reflux study that shows a significant refluxing great  saphenous vein but in further discussing with her daughter she would not want any invasive procedures at this point in her lifetime which is understandable.  I discussed her daughter let us  know if any of her symptoms progress.  No concern for arterial insufficiency as she has great palpable pedal pulses as we discussed.   Lonni DOROTHA Gaskins, MD [facility] Office: [phone]

## 2024-10-08 ENCOUNTER — Other Ambulatory Visit: Payer: Self-pay

## 2024-10-09 LAB — LIPID PANEL
Cholesterol: 151 mg/dL
HDL: 43 mg/dL — ABNORMAL LOW
LDL Cholesterol (Calc): 82 mg/dL
Non-HDL Cholesterol (Calc): 108 mg/dL
Total CHOL/HDL Ratio: 3.5 (calc)
Triglycerides: 154 mg/dL — ABNORMAL HIGH

## 2024-10-09 LAB — TSH: TSH: 31.24 m[IU]/L — ABNORMAL HIGH (ref 0.40–4.50)

## 2024-11-08 ENCOUNTER — Other Ambulatory Visit: Payer: Self-pay | Admitting: Family

## 2024-11-19 ENCOUNTER — Ambulatory Visit: Payer: Self-pay | Admitting: Family
